# Patient Record
Sex: Female | Born: 1985 | Race: White | Hispanic: No | Marital: Married | Smoking: Never smoker
Health system: Southern US, Academic
[De-identification: ages and names within clinical notes are randomized; demographics above are authoritative.]

## PROBLEM LIST (undated history)

## (undated) HISTORY — PX: HX WISDOM TEETH EXTRACTION: SHX21

## (undated) HISTORY — PX: VASCULAR SURGERY: SHX849

---

## 2016-10-23 ENCOUNTER — Ambulatory Visit (HOSPITAL_COMMUNITY): Payer: Self-pay | Admitting: Obstetrics & Gynecology

## 2018-04-09 NOTE — L&D Delivery Note (Signed)
[facility]  Delivery Note      Name: Alexis Bowers   MRN: [id number]  DOB:  [birth year]  Admitted: 03/23/2019 12:20 PM       Pre-delivery diagnosis:    1. 33 y.o. G1P0 at [redacted]w[redacted]d gestation.   2. Spontaneous labor    Post-delivery diagnosis:    1. Same plus delivery of a viable neonate.     Findings:           Vaginal delivery of viable term infant/s. Infant(s) suctioned. Cord clamped x2 and cut. Infant to maternal abdomen.  Cord blood collected.  Placenta delivered. Good hemostasis noted. See below for delivery details.     Spontaneous vaginal delivery over second-degree midline perineal laceration.  Viable female infant weighing 7 lb 8 oz and Apgars of 8 and 9 at 1 and 5 minutes respectively.  Spontaneous delivery placenta 3 vessel cord.    Second-degree midline laceration repaired with 2-0 Vicryl in standard fashion    EBL 150 cc    Disposition:  Infant(s) admitted to floor (rooming in with mother).  Team Member Updated:.    "Delivery Information"    IO Blood Loss  03/23/19 1558 - 03/23/19 2217    QBL - (OB only) Hospital Encounter 150 mL    Total  150 mL         Uecker, Pending [[id number]]    Labor Events    Preterm labor?: No  Antibiotics received during labor?: No  Rupture date/time: 03/23/2019 1558  Rupture type: Artificial  Fluid color: Bloody  Induction: None  Augmentation: None          Labor Event Times    Labor onset Date/Time: 03/23/2019 1558   Dilation complete Date/Time: 03/23/2019 1931     Delivery Anesthesia    Method: Epidural     Assisted Delivery Details    Forceps attempted?: No  Vacuum extractor attempted?: No     Shoulder Dystocia Maneuvers    Shoulder dystocia present?: No     Lacerations    Episiotomy: None   Perineal lacerations: 2nd Repaired: Yes    Repaired: No   Repair suture: 2-0 Vicryl     Delivery Information    Birth date/time: 03/23/2019 2140  Sex: Female  Delivery type: Vaginal, Spontaneous     Newborn Presentation    Presentation: Vertex     Cord Information    Vessels:  3 Vessels  Complications: None  Cord blood disposition: Lab, Cord Segment Sent  Gases sent?: No  Stem cell collection (by MD): No     Resuscitation    Method: Oxygen, Suctioning  Neonatal resuscitation comments: blow by     Newborn  Apgars    Living status: Living      Skin color:    Heart rate:    Reflex Irrit:    Muscle tone:    Resp. effort:    Total:     1 Min:    0    2    2    2    2    8     5  Min:    1    2    2    2    2    9     10  Min:     15 Min:     20 Min:          Skin to Skin    Skin to skin initiation: 03/23/2019 2146  Skin to skin  with: Mother     Placenta    Date/time: 03/23/2019 2144  Removal: Spontaneous  Appearance: [name] , Intact  Disposition: Discarded     Other Delivery Procedures    Procedures: None     Delivery Providers    Delivering Clinician: Deloris Ping, MD   Provider Role   Radene Ou, RN Registered Nurse   Minta Balsam, RN Baby Nurse               Labor Event Times    Labor onset date/time: 03/23/2019 1558  Dilation complete date/time: 03/23/2019 1931            Deloris Ping, MD 03/23/2019, 22:17

## 2018-06-12 ENCOUNTER — Ambulatory Visit (HOSPITAL_COMMUNITY): Payer: Self-pay | Admitting: Surgery

## 2018-07-22 ENCOUNTER — Other Ambulatory Visit: Payer: Self-pay

## 2018-07-23 ENCOUNTER — Ambulatory Visit (INDEPENDENT_AMBULATORY_CARE_PROVIDER_SITE_OTHER): Payer: Self-pay | Admitting: Obstetrics & Gynecology

## 2018-07-23 ENCOUNTER — Telehealth: Payer: 59 | Admitting: Obstetrics & Gynecology

## 2018-07-23 ENCOUNTER — Encounter (INDEPENDENT_AMBULATORY_CARE_PROVIDER_SITE_OTHER): Payer: Self-pay | Admitting: Obstetrics & Gynecology

## 2018-07-23 DIAGNOSIS — Z3A01 Less than 8 weeks gestation of pregnancy: Secondary | ICD-10-CM

## 2018-07-23 DIAGNOSIS — Z3401 Encounter for supervision of normal first pregnancy, first trimester: Secondary | ICD-10-CM

## 2018-07-23 NOTE — Progress Notes (Signed)
TELEMEDICINE DOCUMENTATION:    Patient Location: telephone visit from home address: [address]  [place] [address]    Patient/family aware of provider location:  yes  Patient/family consent for telemedicine:  yes  Examination observed and performed by:  Lynden Oxford, MD  15 minutes was spent in discussion with the patient    Patient is an 33 y.o. year-old G1P0 at [redacted]w[redacted]d by lmp presenting for initial prenatal visit.  Pregnancy has been uncomplicated thus far. Taking pnv.  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     OB: G0  Gyn: denies history of abnormal paps or history of std    History reviewed. No pertinent past medical history.      No current outpatient medications on file.     Past Surgical History:   Procedure Laterality Date   . HX WISDOM TEETH EXTRACTION     . VASCULAR SURGERY Bilateral          Social History     Socioeconomic History   . Marital status: Single     Spouse name: Not on file   . Number of children: Not on file   . Years of education: Not on file   . Highest education level: Not on file   Tobacco Use   . Smoking status: Never Smoker   . Smokeless tobacco: Never Used   Substance and Sexual Activity   . Drug use: Never   . Sexual activity: Yes     Partners: Male       Review of Systems:  Constitutional: negative  HEENT: negative  Cardiac: negative  Respiratory: negative  GI: negative  GU: negative  Neuro: negative  Psych: negative    Physical Exam:  LMP 06/15/2018     deferred  General: alert, oriented, no acute distress  Psych: appropriate mood and affect      DIAGNOSIS: early pregnancy    Pap, cultures and labs deferred.  Genetic screening discussed and will consider.  Discussed practice dynamics, routine ob care.   Questions answered re caffeine, exercise, etc..  No follow-ups on file.

## 2018-07-30 ENCOUNTER — Telehealth (INDEPENDENT_AMBULATORY_CARE_PROVIDER_SITE_OTHER): Payer: Self-pay | Admitting: Obstetrics & Gynecology

## 2018-07-30 NOTE — Telephone Encounter (Signed)
Returned call to patient. Discussed all OTC meds for nausea. Ginger tablets. B6 & Doxylamine.  She voices understanding. Celestine Bougie, RN  07/30/2018, 10:44

## 2018-09-05 ENCOUNTER — Encounter (FREE_STANDING_LABORATORY_FACILITY): Payer: 59 | Admitting: Obstetrics & Gynecology

## 2018-09-05 ENCOUNTER — Ambulatory Visit (INDEPENDENT_AMBULATORY_CARE_PROVIDER_SITE_OTHER): Payer: 59 | Admitting: Obstetrics & Gynecology

## 2018-09-05 ENCOUNTER — Other Ambulatory Visit: Payer: Self-pay

## 2018-09-05 ENCOUNTER — Encounter (FREE_STANDING_LABORATORY_FACILITY)
Admit: 2018-09-05 | Discharge: 2018-09-05 | Disposition: A | Discharge status: Home / Self Care | Payer: 59 | Attending: Obstetrics & Gynecology | Admitting: Obstetrics & Gynecology

## 2018-09-05 VITALS — BP 100/60 | HR 98 | Wt 122.3 lb

## 2018-09-05 DIAGNOSIS — Z363 Encounter for antenatal screening for malformations: Secondary | ICD-10-CM

## 2018-09-05 DIAGNOSIS — Z369 Encounter for antenatal screening, unspecified: Secondary | ICD-10-CM

## 2018-09-05 DIAGNOSIS — Z3401 Encounter for supervision of normal first pregnancy, first trimester: Secondary | ICD-10-CM

## 2018-09-05 LAB — CBC
HCT: 34.3 % — ABNORMAL LOW (ref 34.8–46.0)
HGB: 11.6 g/dL (ref 11.5–16.0)
MCH: 31.2 pg (ref 26.0–32.0)
MCHC: 33.8 g/dL (ref 31.0–35.5)
MCV: 92.2 fL (ref 78.0–100.0)
MPV: 11.3 fL (ref 8.7–12.5)
PLATELETS: 199 10*3/uL (ref 150–400)
RBC: 3.72 10*6/uL — ABNORMAL LOW (ref 3.85–5.22)
RDW-CV: 13.1 % (ref 11.5–15.5)
WBC: 7.4 10*3/uL (ref 3.7–11.0)

## 2018-09-05 LAB — HIV1/HIV2 SCREEN, COMBINED ANTIGEN AND ANTIBODY: HIV SCREEN, COMBINED ANTIGEN & ANTIBODY: NEGATIVE

## 2018-09-05 LAB — HEPATITIS B SURFACE ANTIGEN: HBV SURFACE ANTIGEN QUALITATIVE: NEGATIVE

## 2018-09-05 NOTE — Progress Notes (Signed)
Alexis Bowers is a 33 y.o. female currently at [redacted]w[redacted]d      Chief Complaint   Patient presents with   . Routine Prenatal Visit   patient seen by Silvestre Gunner, PGY3.  No complaints.  Prenatal labs today.  Declines genetic screening.    OB History   Gravida Para Term Preterm AB Living   1 0 0 0 0 0   SAB TAB Ectopic Multiple Live Births   0 0 0 0 0       The ACOG flow sheet has been reviewed and updated.     Weight: 55.5 kg (122 lb 4.8 oz)  BP (Non-Invasive): 100/60  Fetal Movement: Absent  Edema: Negative  FHR (1): 122      ICD-10-CM    1. Primigravida in first trimester Z34.01    2. Prenatal screening encounter Z36.9 ABO/RH AND ANTIBODY SCREEN     CBC     RUBELLA VIRUS ANTIBODIES, IGG, SERUM     SYPHILIS SCREENING ALGORITHM WITH REFLEX (TITER, TP-PA), SERUM     URINE CULTURE     HEPATITIS B SURFACE ANTIGEN     HIV1/HIV2 SCREEN, COMBINED ANTIGEN AND ANTIBODY     CHLAMYDIA TRACHOMITIS DNA BY PCR (INHOUSE)     NEISSERIA GONORRHOEAE DNA BY PCR     HEPATITIS C ANTIBODY SCREEN WITH REFLEX TO HCV PCR   3. Screening, antenatal, for malformation by ultrasound Z36.3 OBG US TRANSABDOMINAL,AFTER 1ST TRI >14WKS       Return in about 4 weeks (around 10/03/2018) for ROB.    Lynden Oxford, MD 09/05/2018, 11:45

## 2018-09-05 NOTE — Nursing Note (Signed)
New ob labs drawn per order for this encounter, pt tolerated well.  Clayton Lefort, MA  09/05/2018, 12:01

## 2018-09-06 LAB — HEPATITIS C ANTIBODY SCREEN WITH REFLEX TO HCV PCR: HCV ANTIBODY QUALITATIVE: NEGATIVE

## 2018-09-06 LAB — URINE CULTURE: URINE CULTURE: >100000

## 2018-09-06 LAB — ABO/RH AND ANTIBODY SCREEN
ABO/RH(D): O POS
ANTIBODY SCREEN: NEGATIVE
ANTIBODY SCREEN: NEGATIVE

## 2018-09-07 LAB — CHLAMYDIA TRACHOMITIS DNA BY PCR (INHOUSE): CHLAMYDIA TRACHOMATIS PCR: NOT DETECTED

## 2018-09-07 LAB — NEISSERIA GONORRHOEAE DNA BY PCR: NEISSERIA GONORRHOEAE PCR: NOT DETECTED

## 2018-09-08 LAB — SYPHILIS SCREENING ALGORITHM WITH REFLEX (TITER, TP-PA), SERUM: TREPONEMAL AB QUALITATIVE: NONREACTIVE

## 2018-09-08 LAB — RUBELLA VIRUS ANTIBODIES, IGG, SERUM
RUBELLA IGG QUALITATIVE: 19.7 mmol/L — AB (ref 21.0–27.0)
RUBELLA IGG QUALITATIVE: POSITIVE

## 2018-09-17 ENCOUNTER — Telehealth (INDEPENDENT_AMBULATORY_CARE_PROVIDER_SITE_OTHER): Payer: Self-pay | Admitting: Obstetrics & Gynecology

## 2018-09-17 NOTE — Telephone Encounter (Signed)
Returned call to patient. Explained that we can not do an [place] before 20 weeks unless she was bleeding. Told her there are private companies that will do [place] for pregnant women.  She voices understanding. Barbara Keng, RN  09/17/2018, 10:26

## 2018-10-02 ENCOUNTER — Other Ambulatory Visit: Payer: Self-pay

## 2018-10-03 ENCOUNTER — Telehealth: Payer: 59 | Admitting: Primary Care

## 2018-10-03 ENCOUNTER — Encounter (INDEPENDENT_AMBULATORY_CARE_PROVIDER_SITE_OTHER): Payer: Self-pay | Admitting: Primary Care

## 2018-10-03 DIAGNOSIS — Z3A15 15 weeks gestation of pregnancy: Secondary | ICD-10-CM

## 2018-10-03 DIAGNOSIS — Z3402 Encounter for supervision of normal first pregnancy, second trimester: Secondary | ICD-10-CM

## 2018-10-03 DIAGNOSIS — Z3400 Encounter for supervision of normal first pregnancy, unspecified trimester: Secondary | ICD-10-CM | POA: Insufficient documentation

## 2018-10-03 NOTE — Progress Notes (Signed)
Obstetrics and [facility] [place] [address]  Phone: [phone]   [place] VISIT VIA telephone    Name: Alexis Bowers  MRN: [id number]  DOB: [birth year]    Date of Service:  10/03/2018    TELEMEDICINE DOCUMENTATION:  Patient Location visit from home address: [place] [address]   Patient/family aware of provider location:  yes  Patient/family consent for telemedicine:  yes  Examination observed and performed by: Burnett Harry, APRN,FNP-BC          Chief Complaint   Patient presents with   . Routine Prenatal Visit   telephone visit today, unable to use MyChart  Doing well, no complaints. N/v is improving.  Reviewed prenatal labs, all WNL.   Discussed covid-19 r/t pregnancy, prenatal care, and labor and delivery. Encouraged to follow CDC guidelines r/t COVID.   Plan RTC rob/sono for growth and anatomy 4 weeks.     OB History   Gravida Para Term Preterm AB Living   1 0 0 0 0 0   SAB TAB Ectopic Multiple Live Births   0 0 0 0 0       Gestational age: [redacted]w[redacted]d           ICD-10-CM    1. Primigravida in second trimester Z34.02        Disposition: Return in about 4 weeks (around 10/31/2018) for Sono/ROB.    Burnett Harry, APRN,FNP-BC 10/03/2018, 09:22

## 2018-10-27 ENCOUNTER — Telehealth (INDEPENDENT_AMBULATORY_CARE_PROVIDER_SITE_OTHER): Payer: Self-pay | Admitting: Obstetrics & Gynecology

## 2018-10-27 NOTE — Telephone Encounter (Signed)
Call from patient with complaint of being dizzy & lightheaded at times. She thinks her blood sugar is dropping at those times. Discussed the need to eat frequently & have a protein to maintain blood sugars & to drink plenty of water. She has tried numerous prenatal vitamins & realized that the one she was taking did not have folic acid.  She is now taking one with folic acid. Explained that should be fine since many foods we eat have folic acid. She has numerous questions. Instructed her to write all her questions down for her appointment on 11/03/18.  She voices understanding. Melinna Linarez, RN  10/27/2018, 08:51

## 2018-11-03 ENCOUNTER — Other Ambulatory Visit (INDEPENDENT_AMBULATORY_CARE_PROVIDER_SITE_OTHER): Payer: 59

## 2018-11-03 ENCOUNTER — Encounter (INDEPENDENT_AMBULATORY_CARE_PROVIDER_SITE_OTHER): Payer: Self-pay | Admitting: Advanced Practice Midwife

## 2018-11-03 ENCOUNTER — Ambulatory Visit (INDEPENDENT_AMBULATORY_CARE_PROVIDER_SITE_OTHER): Payer: 59 | Admitting: Advanced Practice Midwife

## 2018-11-03 ENCOUNTER — Other Ambulatory Visit: Payer: Self-pay

## 2018-11-03 VITALS — BP 106/60 | Wt 134.2 lb

## 2018-11-03 DIAGNOSIS — Z3402 Encounter for supervision of normal first pregnancy, second trimester: Secondary | ICD-10-CM

## 2018-11-03 DIAGNOSIS — Z3A20 20 weeks gestation of pregnancy: Secondary | ICD-10-CM

## 2018-11-03 DIAGNOSIS — Z3400 Encounter for supervision of normal first pregnancy, unspecified trimester: Secondary | ICD-10-CM

## 2018-11-03 DIAGNOSIS — Z363 Encounter for antenatal screening for malformations: Secondary | ICD-10-CM

## 2018-11-03 NOTE — Progress Notes (Signed)
Obstetrics and Gynecology [address]  [place] [address]  Phone: [phone]       Chief Complaint   Patient presents with   . Routine Prenatal Visit   Anatomy u/s today (girl)-Unremarkable fetal anatomy.  She is feeling fetal movement.  Nausea and vomiting has improved since last week.  She had one episode of dizziness last week but no further episodes.  She is trying to increase her water and eat small, frequent snacks. Pt has gained 10 lbs during pregnancy.  Discussed telemedicine appointment at next appointment and 8 wks in person with glucola.      OB History   Gravida Para Term Preterm AB Living   1 0 0 0 0 0   SAB TAB Ectopic Multiple Live Births   0 0 0 0 0       Gestational age: [redacted]w[redacted]d    Weight: 60.9 kg (134 lb 3.2 oz)  BP (Non-Invasive): 106/60  # of fetuses: 1  Fundal height: umbilicus  Preterm Labor: None  Fetal Movement: Present  Edema: Negative  FHR (1): 133 sono  Comments: Unremarkable fetal anatomy u/s      ICD-10-CM    1. Pregnancy, first Z34.00        Disposition: Return in about 4 weeks (around 12/01/2018), or telemedicine, 8 wks ROB with glucola.    Allie Dimmer, APRN 11/03/2018, 12:35

## 2018-12-01 ENCOUNTER — Encounter (INDEPENDENT_AMBULATORY_CARE_PROVIDER_SITE_OTHER): Payer: Self-pay | Admitting: Advanced Practice Midwife

## 2018-12-01 ENCOUNTER — Telehealth (INDEPENDENT_AMBULATORY_CARE_PROVIDER_SITE_OTHER): Payer: 59 | Admitting: Advanced Practice Midwife

## 2018-12-01 ENCOUNTER — Encounter (INDEPENDENT_AMBULATORY_CARE_PROVIDER_SITE_OTHER): Payer: Self-pay

## 2018-12-01 DIAGNOSIS — Z3A24 24 weeks gestation of pregnancy: Secondary | ICD-10-CM

## 2018-12-01 DIAGNOSIS — O9989 Other specified diseases and conditions complicating pregnancy, childbirth and the puerperium: Secondary | ICD-10-CM

## 2018-12-01 DIAGNOSIS — M545 Low back pain: Secondary | ICD-10-CM

## 2018-12-01 DIAGNOSIS — M25559 Pain in unspecified hip: Secondary | ICD-10-CM

## 2018-12-01 NOTE — Progress Notes (Signed)
Obstetrics and Gynecology [place] [address]  Phone: [place] VISIT VIA Telephone      Name: Alexis Bowers  MRN: [id number]  DOB: [birth year]    Date of Service:  12/01/2018    TELEMEDICINE DOCUMENTATION:  Patient Location:  MyChart video visit from home address: [place] [address]   Patient/family aware of provider location:  yes  Patient/family consent for phone visit:  yes  Examination observed and performed by: Allie Dimmer, APRN        Chief Complaint   Patient presents with   . Routine Prenatal Visit   Pt with some hip and lower back pain.  Discussed trying a maternity support belt for extra support.  Feeling more fetal movement since last appointment.  Glucola at next appointment in 4 weeks.  Questionable hypoechoic complex structure shadowing the right ovary, will get f/u sono in 8 weeks.      OB History   Gravida Para Term Preterm AB Living   1 0 0 0 0 0   SAB TAB Ectopic Multiple Live Births   0 0 0 0 0       Gestational age: [redacted]w[redacted]d           ICD-10-CM    1. Pregnancy, first, obstetrical care, second trimester  Z34.02    2. Cyst of right ovary  N83.201 OBG US TRANSABDOMINAL, FOLLOW UP       Disposition: Return 8 wks sono/rob, for ROB.    Allie Dimmer, APRN 12/01/2018, 11:11

## 2018-12-26 ENCOUNTER — Encounter (INDEPENDENT_AMBULATORY_CARE_PROVIDER_SITE_OTHER): Payer: Self-pay | Admitting: Obstetrics & Gynecology

## 2018-12-29 ENCOUNTER — Ambulatory Visit (INDEPENDENT_AMBULATORY_CARE_PROVIDER_SITE_OTHER): Payer: 59 | Admitting: Primary Care

## 2018-12-29 ENCOUNTER — Encounter (FREE_STANDING_LABORATORY_FACILITY): Payer: 59 | Admitting: Primary Care

## 2018-12-29 ENCOUNTER — Other Ambulatory Visit: Payer: Self-pay

## 2018-12-29 ENCOUNTER — Encounter (FREE_STANDING_LABORATORY_FACILITY)
Admit: 2018-12-29 | Discharge: 2018-12-29 | Disposition: A | Discharge status: Home / Self Care | Payer: 59 | Attending: Primary Care | Admitting: Primary Care

## 2018-12-29 ENCOUNTER — Encounter (INDEPENDENT_AMBULATORY_CARE_PROVIDER_SITE_OTHER): Payer: Self-pay | Admitting: Primary Care

## 2018-12-29 VITALS — BP 100/60 | Wt 143.4 lb

## 2018-12-29 DIAGNOSIS — Z3400 Encounter for supervision of normal first pregnancy, unspecified trimester: Secondary | ICD-10-CM

## 2018-12-29 DIAGNOSIS — Z369 Encounter for antenatal screening, unspecified: Secondary | ICD-10-CM | POA: Insufficient documentation

## 2018-12-29 DIAGNOSIS — Z3403 Encounter for supervision of normal first pregnancy, third trimester: Secondary | ICD-10-CM

## 2018-12-29 DIAGNOSIS — Z6740 Type O blood, Rh positive: Secondary | ICD-10-CM | POA: Insufficient documentation

## 2018-12-29 DIAGNOSIS — Z3A28 28 weeks gestation of pregnancy: Secondary | ICD-10-CM

## 2018-12-29 LAB — CBC
HCT: 34.6 % — ABNORMAL LOW (ref 34.8–46.0)
HGB: 11.3 g/dL — ABNORMAL LOW (ref 11.5–16.0)
MCH: 30.1 pg (ref 26.0–32.0)
MCHC: 32.7 g/dL (ref 31.0–35.5)
MCV: 92.3 fL (ref 78.0–100.0)
MPV: 11.9 fL (ref 8.7–12.5)
PLATELETS: 188 10*3/uL (ref 150–400)
RBC: 3.75 10*6/uL — ABNORMAL LOW (ref 3.85–5.22)
RDW-CV: 13 % (ref 11.5–15.5)
WBC: 8.6 10*3/uL (ref 3.7–11.0)

## 2018-12-29 LAB — GLUCOSE TOLERANCE TEST (GTT), 1 HOUR: GLUCOSE 1 HR POST DOSE: 120 mg/dL (ref <140)

## 2018-12-29 LAB — ABO/RH AND ANTIBODY SCREEN
ABO/RH(D): O POS
ANTIBODY SCREEN: NEGATIVE

## 2018-12-29 NOTE — Progress Notes (Signed)
Obstetrics and [facility] [place] [address]  Phone: [phone]       Chief Complaint   Patient presents with   . Prenatal Check     Doing well, no complaints. Reports good fetal movement. Denies contractions, ROM, vaginal bleeding.   Third trimester labs today. Declines Tdap and flu vaccines, both were advised to the patient.   Discussed covid-19 r/t pregnancy, prenatal care, and labor and delivery. Encouraged to follow CDC guidelines r/t COVID.     OB History   Gravida Para Term Preterm AB Living   1 0 0 0 0 0   SAB TAB Ectopic Multiple Live Births   0 0 0 0 0       Gestational age: [redacted]w[redacted]d    Weight: 65 kg (143 lb 6.4 oz)  BP (Non-Invasive): 100/60  Fundal height: 28  Preterm Labor: None  Fetal Movement: Present  Edema: Negative  FHR (1): 160s      ICD-10-CM    1. Pregnancy, first  Z34.00 GLUCOSE TOLERANCE TEST (GTT), 1 HOUR     CBC     ABO/RH AND ANTIBODY SCREEN     SYPHILIS SCREENING ALGORITHM WITH REFLEX (TITER, TP-PA), SERUM   2. Antenatal screening encounter  Z36.9 GLUCOSE TOLERANCE TEST (GTT), 1 HOUR     CBC     ABO/RH AND ANTIBODY SCREEN     SYPHILIS SCREENING ALGORITHM WITH REFLEX (TITER, TP-PA), SERUM       Disposition: Return in about 4 weeks (around 01/26/2019) for Sono/ROB.    Burnett Harry, APRN,FNP-BC 12/29/2018, 13:11

## 2018-12-30 LAB — SYPHILIS SCREENING ALGORITHM WITH REFLEX (TITER, TP-PA), SERUM: TREPONEMAL AB QUALITATIVE: NONREACTIVE

## 2019-01-01 ENCOUNTER — Telehealth (INDEPENDENT_AMBULATORY_CARE_PROVIDER_SITE_OTHER): Payer: Self-pay | Admitting: Primary Care

## 2019-01-01 NOTE — Telephone Encounter (Signed)
Called patient & discussed lab results. She is taking a prenatal that contains iron. Encouraged her to take daily.  She voices understanding. Sheana Bir, RN  01/01/2019, 09:01

## 2019-01-26 ENCOUNTER — Other Ambulatory Visit (INDEPENDENT_AMBULATORY_CARE_PROVIDER_SITE_OTHER): Payer: 59

## 2019-01-26 ENCOUNTER — Ambulatory Visit (INDEPENDENT_AMBULATORY_CARE_PROVIDER_SITE_OTHER): Payer: 59 | Admitting: Primary Care

## 2019-01-26 ENCOUNTER — Encounter (INDEPENDENT_AMBULATORY_CARE_PROVIDER_SITE_OTHER): Payer: Self-pay | Admitting: Primary Care

## 2019-01-26 ENCOUNTER — Other Ambulatory Visit: Payer: Self-pay

## 2019-01-26 VITALS — BP 98/70 | Temp 97.7°F | Wt 146.0 lb

## 2019-01-26 DIAGNOSIS — Z3403 Encounter for supervision of normal first pregnancy, third trimester: Secondary | ICD-10-CM

## 2019-01-26 DIAGNOSIS — O3483 Maternal care for other abnormalities of pelvic organs, third trimester: Secondary | ICD-10-CM

## 2019-01-26 DIAGNOSIS — Z3A32 32 weeks gestation of pregnancy: Secondary | ICD-10-CM

## 2019-01-26 DIAGNOSIS — N83201 Unspecified ovarian cyst, right side: Secondary | ICD-10-CM

## 2019-01-26 NOTE — Progress Notes (Signed)
Obstetrics and [facility] [place] [address]  Phone: [phone]       Chief Complaint   Patient presents with   . Routine Prenatal Visit     Sono today. Cephalic. MVP 4.3cm. Growth 57%.   Doing well, no complaints. Reports good fetal movement. Denies contractions, ROM, vaginal bleeding.   Declines Tdap and flu vaccine.   Discussed covid-19 r/t pregnancy, prenatal care, and labor and delivery. Encouraged to follow CDC guidelines r/t COVID.     OB History   Gravida Para Term Preterm AB Living   1 0 0 0 0 0   SAB TAB Ectopic Multiple Live Births   0 0 0 0 0       Gestational age: [redacted]w[redacted]d    Weight: 66.2 kg (146 lb)  BP (Non-Invasive): 98/70  Fundal height: 57% sono  Preterm Labor: None  Fetal Movement: Present  Presentation: Cephalic  Edema: Negative  FHR (1): 149 sono      ICD-10-CM    1. Primigravida in third trimester  Z34.03        Disposition: Return in about 2 weeks (around 02/09/2019) for ROB.    Burnett Harry, APRN,FNP-BC 01/26/2019, 15:42

## 2019-01-30 ENCOUNTER — Encounter (INDEPENDENT_AMBULATORY_CARE_PROVIDER_SITE_OTHER): Payer: Self-pay | Admitting: Obstetrics & Gynecology

## 2019-01-30 ENCOUNTER — Other Ambulatory Visit (INDEPENDENT_AMBULATORY_CARE_PROVIDER_SITE_OTHER): Payer: Self-pay

## 2019-02-10 ENCOUNTER — Encounter (INDEPENDENT_AMBULATORY_CARE_PROVIDER_SITE_OTHER): Payer: Self-pay | Admitting: Obstetrics & Gynecology

## 2019-02-10 ENCOUNTER — Ambulatory Visit (INDEPENDENT_AMBULATORY_CARE_PROVIDER_SITE_OTHER): Payer: 59 | Admitting: Obstetrics & Gynecology

## 2019-02-10 ENCOUNTER — Other Ambulatory Visit: Payer: Self-pay

## 2019-02-10 VITALS — BP 102/60 | Temp 97.8°F | Ht 67.0 in | Wt 151.0 lb

## 2019-02-10 DIAGNOSIS — Z3403 Encounter for supervision of normal first pregnancy, third trimester: Secondary | ICD-10-CM

## 2019-02-10 DIAGNOSIS — Z3A34 34 weeks gestation of pregnancy: Secondary | ICD-10-CM

## 2019-02-10 NOTE — Progress Notes (Signed)
Alexis Bowers is a 33 y.o. female currently at [redacted]w[redacted]d.  [address] are reviewed.  Patient does have some low back pain and intermittent contractions.   She is also moving which can add to her discomfort.       Chief Complaint   Patient presents with   . Routine Prenatal Visit       OB History   Gravida Para Term Preterm AB Living   1 0 0 0 0 0   SAB TAB Ectopic Multiple Live Births   0 0 0 0 0       Weight: 68.5 kg (151 lb)  BP (Non-Invasive): 102/60  Fundal height: 34  Preterm Labor: [address]  Fetal Movement: Present  Edema: Negative  FHR (1): 140      Assessment    ICD-10-CM    1. Primigravida in third trimester  Z34.03        Plan  Return in about 2 weeks (around 02/24/2019) for ROB with GBS due.    Robet Leu, MD 02/10/2019, 14:58

## 2019-02-18 ENCOUNTER — Telehealth (INDEPENDENT_AMBULATORY_CARE_PROVIDER_SITE_OTHER): Payer: Self-pay | Admitting: Obstetrics & Gynecology

## 2019-02-18 NOTE — Telephone Encounter (Signed)
Call from patient. She states she has been having "lightening crotch" pains, pressure & Braxton Hicks. Denies bleeding or leaking of fluid or decreased fluid. Discussed going to hospital it contractions become 5 minutes apart for one hour or for bleeding, leaking of fluid or decreased movement.  She voices understanding. Zarius Furr, RN  02/18/2019, 10:29

## 2019-02-24 ENCOUNTER — Encounter (INDEPENDENT_AMBULATORY_CARE_PROVIDER_SITE_OTHER): Payer: Self-pay | Admitting: Obstetrics & Gynecology

## 2019-02-24 ENCOUNTER — Ambulatory Visit (INDEPENDENT_AMBULATORY_CARE_PROVIDER_SITE_OTHER): Payer: 59 | Admitting: Obstetrics & Gynecology

## 2019-02-24 ENCOUNTER — Other Ambulatory Visit: Payer: Self-pay

## 2019-02-24 VITALS — BP 102/60 | Temp 97.7°F | Ht 67.0 in | Wt 153.0 lb

## 2019-02-24 DIAGNOSIS — Z3A36 36 weeks gestation of pregnancy: Secondary | ICD-10-CM

## 2019-02-24 DIAGNOSIS — Z3403 Encounter for supervision of normal first pregnancy, third trimester: Secondary | ICD-10-CM

## 2019-02-24 NOTE — Progress Notes (Signed)
Cylinda Santoli is a 33 y.o. female currently at [redacted]w[redacted]d labor precautions are given.  Group B strep was done today.      Chief Complaint   Patient presents with   . Routine Prenatal Visit       OB History   Gravida Para Term Preterm AB Living   1 0 0 0 0 0   SAB TAB Ectopic Multiple Live Births   0 0 0 0 0       Weight: 69.4 kg (153 lb)  BP (Non-Invasive): 102/60  Fundal height: 36  Preterm Labor: [address]  Fetal Movement: Present  Presentation: Cephalic  Edema: Negative  FHR (1): 159      Assessment    ICD-10-CM    1. Encounter for supervision of normal first pregnancy in third trimester  Z34.03 GROUP B STREPTOCOCCUS DNA BY PCR       Plan  Return in about 1 week (around 03/03/2019) for [place].    Robet Leu, MD 02/24/2019, 16:16

## 2019-02-25 ENCOUNTER — Encounter (INDEPENDENT_AMBULATORY_CARE_PROVIDER_SITE_OTHER): Payer: Self-pay

## 2019-02-25 NOTE — Nursing Note (Signed)
UML called in, the lab was sent a culture with no patient information on specimen, no ID sticker, test was cancelled, patient will need to repeat next visit.

## 2019-03-02 ENCOUNTER — Ambulatory Visit (INDEPENDENT_AMBULATORY_CARE_PROVIDER_SITE_OTHER): Payer: 59 | Admitting: Advanced Practice Midwife

## 2019-03-02 ENCOUNTER — Encounter (FREE_STANDING_LABORATORY_FACILITY)
Admit: 2019-03-02 | Discharge: 2019-03-02 | Disposition: A | Discharge status: Home / Self Care | Payer: 59 | Attending: Advanced Practice Midwife | Admitting: Advanced Practice Midwife

## 2019-03-02 ENCOUNTER — Other Ambulatory Visit: Payer: Self-pay

## 2019-03-02 ENCOUNTER — Encounter (FREE_STANDING_LABORATORY_FACILITY): Payer: 59 | Admitting: Advanced Practice Midwife

## 2019-03-02 VITALS — BP 100/64 | Wt 153.0 lb

## 2019-03-02 DIAGNOSIS — Z369 Encounter for antenatal screening, unspecified: Secondary | ICD-10-CM | POA: Insufficient documentation

## 2019-03-02 DIAGNOSIS — Z3403 Encounter for supervision of normal first pregnancy, third trimester: Secondary | ICD-10-CM

## 2019-03-02 DIAGNOSIS — Z3A37 37 weeks gestation of pregnancy: Secondary | ICD-10-CM

## 2019-03-02 NOTE — Progress Notes (Signed)
Obstetrics and Gynecology [address]  [place] [address]  Phone: [phone]       Chief Complaint   Patient presents with   . Routine Prenatal Visit   Pt with normal aches and pains of pregnancy.  Active fetal movement.  Denies ROM or vaginal bleeding. Continues to have irregular contractions.  GBS collected.  Labor precautions and fetal movement reviewed.      OB History   Gravida Para Term Preterm AB Living   1 0 0 0 0 0   SAB TAB Ectopic Multiple Live Births   0 0 0 0 0       Gestational age: [redacted]w[redacted]d    Weight: 69.4 kg (153 lb)  BP (Non-Invasive): 100/64  # of fetuses: 1  Fundal height: 37  Preterm Labor: [address]  Fetal Movement: Present  Presentation: Cephalic  Edema: Negative  FHR (1): 150s      ICD-10-CM    1. Pregnancy, first, obstetrical care, third trimester  Z34.03    2. Visit for prenatal screening  Z36.9 GROUP B STREPTOCOCCUS DNA BY PCR     GROUP B STREPTOCOCCUS DNA BY PCR       Disposition: Return in about 1 week (around 03/09/2019) for ROB.    Allie Dimmer, APRN 03/02/2019, 16:21

## 2019-03-04 LAB — GROUP B STREPTOCOCCUS DNA BY NAAT: GROUP B STREPTOCOCCUS (GBS) DNA BY NAAT: NEGATIVE

## 2019-03-09 NOTE — Progress Notes (Signed)
OB/GYN CLINIC, PHYSICIANS OFFICE BUILDING  [place] [address]  [facility]  Telephone Visit     Name: Alexis Bowers  MRN: [id number]    Date: 03/10/2019  Age: 33 y.o.                          Patient's location: [place] [id number]   Patient/family aware of provider location: Yes  Patient/family consent for telphone visit: Yes  Interview and observation performed by: Jeneen Montgomery, NP    NOTE AUTHORED BY Jeneen Montgomery, MSN, APRN, [facility] - per [facility] requirements.    Chief Complaint   Patient presents with   . Routine Prenatal Visit     33yo G1P0 in clinic today for [place] visit at [redacted]W[redacted]D in office. Pt denies s/sx UIT/PIH/Labor. Fetal movemenet is present per pt. Pt c/o Braxton hicks contractions, denies ROM/VB.      OB History   Gravida Para Term Preterm AB Living   1             SAB TAB Ectopic Multiple Live Births                  # Outcome Date GA Lbr Len/2nd Weight Sex Delivery Anes PTL Lv   1 Current              Preterm Labor: [address]  Fetal Movement: Present  Presentation: Unable To Assess  Edema: Negative  FHR (1): +fetal movement  Comments: telephone visit      ICD-10-CM    1. Encounter for supervision of normal first pregnancy in third trimester  Z34.03      Pt ed on fetal kick count, s/sx UTI/PIH/Labor, and when to seek emergent care reviewed. GBS completed and negative. Pt ed on COVID-19 recommendations on social distancing and need to space f/u visit, pt agrees with plan of care. Pt ed on the need to monitor BP and weight at home at home with telephone and video visits when available. Pt ed on need to self quarantine after 37 weeks until deliver other than her visits    Disposition: Return in about 1 week (around 03/17/2019) for [place] in office.    Jeneen Montgomery, NP  03/10/2019, 15:04

## 2019-03-10 ENCOUNTER — Telehealth: Payer: 59 | Admitting: Nurse Practitioner

## 2019-03-10 ENCOUNTER — Encounter (INDEPENDENT_AMBULATORY_CARE_PROVIDER_SITE_OTHER): Payer: Self-pay | Admitting: Nurse Practitioner

## 2019-03-10 DIAGNOSIS — O471 False labor at or after 37 completed weeks of gestation: Secondary | ICD-10-CM

## 2019-03-10 DIAGNOSIS — Z3403 Encounter for supervision of normal first pregnancy, third trimester: Secondary | ICD-10-CM

## 2019-03-10 DIAGNOSIS — Z3A38 38 weeks gestation of pregnancy: Secondary | ICD-10-CM

## 2019-03-16 NOTE — Progress Notes (Signed)
NOTE AUTHORED BY Jeneen Montgomery, MSN, APRN, [facility] - per [facility] requirements.    Chief Complaint   Patient presents with   . Routine Prenatal Visit     33yo G1P0 in clinic today for [place] visit at [redacted]W[redacted]D in office. Pt denies s/sx UIT/PIH/Labor. Fetal movemenet is present per pt. Pt c/o Braxton hicks contractions, denies ROM/VB. Induction sch for 03/29/2019 at 2000 for post dates. Pt declined examination today.      OB History   Gravida Para Term Preterm AB Living   1             SAB TAB Ectopic Multiple Live Births                  # Outcome Date GA Lbr Len/2nd Weight Sex Delivery Anes PTL Lv   1 Current              Weight: 70.8 kg (156 lb)  BP (Non-Invasive): 112/70  Fundal height: 39  Preterm Labor: [address]  Fetal Movement: Present  Presentation: Unable To Assess  Edema: Negative  FHR (1): 135      ICD-10-CM    1. Encounter for supervision of normal first pregnancy in third trimester  Z34.03      Pt ed on fetal kick count, s/sx UTI/PIH/Labor, and when to seek emergent care reviewed. Ed on ligament pain,body mechanics, and encouraged abdominal support band. GBS completed and negative. Pt ed on COVID-19 recommendations on social distancing and need to self quarantine until deliver other than her visits    Disposition: Return in about 1 week (around 03/24/2019) for [place] in office.    Jeneen Montgomery, NP  03/17/2019, 12:23

## 2019-03-17 ENCOUNTER — Encounter (INDEPENDENT_AMBULATORY_CARE_PROVIDER_SITE_OTHER): Payer: Self-pay | Admitting: Nurse Practitioner

## 2019-03-17 ENCOUNTER — Ambulatory Visit (INDEPENDENT_AMBULATORY_CARE_PROVIDER_SITE_OTHER): Payer: 59 | Admitting: Nurse Practitioner

## 2019-03-17 ENCOUNTER — Other Ambulatory Visit: Payer: Self-pay

## 2019-03-17 VITALS — BP 112/70 | Ht 67.0 in | Wt 156.0 lb

## 2019-03-17 DIAGNOSIS — Z3403 Encounter for supervision of normal first pregnancy, third trimester: Secondary | ICD-10-CM

## 2019-03-17 DIAGNOSIS — Z3A39 39 weeks gestation of pregnancy: Secondary | ICD-10-CM

## 2019-03-18 ENCOUNTER — Other Ambulatory Visit: Payer: Self-pay

## 2019-03-18 ENCOUNTER — Ambulatory Visit (HOSPITAL_COMMUNITY): Payer: 59 | Admitting: Obstetrics & Gynecology

## 2019-03-18 ENCOUNTER — Ambulatory Visit
Admission: RE | Admit: 2019-03-18 | Discharge: 2019-03-18 | Disposition: A | Discharge status: Home / Self Care | Payer: 59 | Source: Ambulatory Visit | Attending: Obstetrics & Gynecology | Admitting: Obstetrics & Gynecology

## 2019-03-18 DIAGNOSIS — Z3490 Encounter for supervision of normal pregnancy, unspecified, unspecified trimester: Secondary | ICD-10-CM | POA: Insufficient documentation

## 2019-03-18 DIAGNOSIS — Z3A00 Weeks of gestation of pregnancy not specified: Secondary | ICD-10-CM | POA: Insufficient documentation

## 2019-03-18 NOTE — Nurses Notes (Signed)
Dr honaker notified of pt status.Pt instructed to return to triage if any leaking fluid, bleeding, decreased fetal movement, or contractions less than 5 minutes apart for 1 hour. Pt instructed to keep next regularly scheduled MD appt. Pt verbalized understanding and discharged from triage at this time.

## 2019-03-19 ENCOUNTER — Other Ambulatory Visit: Payer: Self-pay

## 2019-03-19 ENCOUNTER — Ambulatory Visit (HOSPITAL_COMMUNITY): Payer: 59

## 2019-03-19 ENCOUNTER — Ambulatory Visit
Admission: RE | Admit: 2019-03-19 | Discharge: 2019-03-19 | Disposition: A | Discharge status: Home / Self Care | Payer: 59 | Source: Ambulatory Visit | Attending: Obstetrics & Gynecology | Admitting: Obstetrics & Gynecology

## 2019-03-19 ENCOUNTER — Ambulatory Visit (HOSPITAL_COMMUNITY): Payer: 59 | Admitting: Obstetrics & Gynecology

## 2019-03-19 DIAGNOSIS — Z3A00 Weeks of gestation of pregnancy not specified: Secondary | ICD-10-CM | POA: Insufficient documentation

## 2019-03-19 DIAGNOSIS — Z3490 Encounter for supervision of normal pregnancy, unspecified, unspecified trimester: Secondary | ICD-10-CM | POA: Insufficient documentation

## 2019-03-19 NOTE — Nurses Notes (Signed)
Pt presents to triage for repeat pelvic exam and nst before traveling home to [place]. Pt states contractions have spaced out since yesterday. Denies leaking fluid or vaginal bleeding.

## 2019-03-19 NOTE — Nurses Notes (Signed)
Pt instructed to return to triage if any leaking fluid, bleeding, decreased fetal movement, or contractions less than 5 minutes apart for 1 hour. Pt instructed to keep next regularly scheduled MD appt. Pt verbalized understanding and discharged from triage at this time.

## 2019-03-23 ENCOUNTER — Inpatient Hospital Stay (HOSPITAL_COMMUNITY): Payer: 59 | Admitting: Anesthesiology

## 2019-03-23 ENCOUNTER — Encounter (HOSPITAL_COMMUNITY): Payer: Self-pay | Admitting: Obstetrics & Gynecology

## 2019-03-23 ENCOUNTER — Inpatient Hospital Stay (HOSPITAL_COMMUNITY): Payer: Self-pay | Admitting: Obstetrics & Gynecology

## 2019-03-23 ENCOUNTER — Inpatient Hospital Stay
Admission: RE | Admit: 2019-03-23 | Discharge: 2019-03-25 | DRG: 807 | Disposition: A | Discharge status: Home / Self Care | Payer: 59 | Attending: Obstetrics & Gynecology | Admitting: Obstetrics & Gynecology

## 2019-03-23 ENCOUNTER — Other Ambulatory Visit: Payer: Self-pay

## 2019-03-23 ENCOUNTER — Encounter (HOSPITAL_COMMUNITY): Payer: Self-pay | Admitting: Anesthesiology

## 2019-03-23 DIAGNOSIS — R109 Unspecified abdominal pain: Secondary | ICD-10-CM

## 2019-03-23 DIAGNOSIS — Z3A40 40 weeks gestation of pregnancy: Secondary | ICD-10-CM

## 2019-03-23 DIAGNOSIS — Z79899 Other long term (current) drug therapy: Secondary | ICD-10-CM

## 2019-03-23 DIAGNOSIS — O26899 Other specified pregnancy related conditions, unspecified trimester: Secondary | ICD-10-CM

## 2019-03-23 LAB — TYPE AND SCREEN
ABO/RH(D): O POS
ANTIBODY SCREEN: NEGATIVE

## 2019-03-23 LAB — URINALYSIS, MACRO/MICRO
BILIRUBIN: NEGATIVE mg/dL
GLUCOSE: NEGATIVE mg/dL
KETONES: NEGATIVE mg/dL
NITRITE: NEGATIVE
PH: 7 (ref 5.0–7.0)
PROTEIN: NEGATIVE mg/dL
SPECIFIC GRAVITY: 1.016 (ref 1.010–1.025)
UROBILINOGEN: 0.2 mg/dL

## 2019-03-23 LAB — URINE DRUG SCREEN, OB
AMPHETAMINES URINE: NEGATIVE
BARBITURATES URINE: NEGATIVE
BENZODIAZEPINES URINE: NEGATIVE
BUPRENORPHINE URINE: NEGATIVE
CANNABINOIDS URINE: NEGATIVE
COCAINE METABOLITES URINE: NEGATIVE
METHADONE URINE: NEGATIVE
OPIATES URINE: NEGATIVE
OXYCODONE URINE: NEGATIVE
PCP URINE: NEGATIVE

## 2019-03-23 LAB — CBC
HCT: 35.2 % (ref 34.6–46.2)
HGB: 11.7 g/dL — ABNORMAL LOW (ref 11.8–15.8)
MCH: 28.5 pg (ref 27.6–33.2)
MCHC: 33.3 g/dL (ref 32.6–35.4)
MCV: 85.5 fL (ref 82.3–96.7)
MPV: 10.2 fL (ref 6.6–10.2)
PLATELETS: 176 10*3/uL (ref 140–440)
RBC: 4.12 10*6/uL (ref 3.80–5.24)
RDW: 13.2 % (ref 12.4–15.2)
WBC: 8.6 10*3/uL (ref 3.5–10.3)

## 2019-03-23 MED ORDER — DOCUSATE SODIUM 100 MG CAPSULE
100.0000 mg | ORAL_CAPSULE | Freq: Two times a day (BID) | ORAL | Status: DC | PRN
Start: 2019-03-23 — End: 2019-03-23

## 2019-03-23 MED ORDER — IBUPROFEN 600 MG TABLET
600.0000 mg | ORAL_TABLET | Freq: Four times a day (QID) | ORAL | Status: DC | PRN
Start: 2019-03-23 — End: 2019-03-25
  Administered 2019-03-23 – 2019-03-25 (×5): 600 mg via ORAL
  Filled 2019-03-23 (×5): qty 1

## 2019-03-23 MED ORDER — SODIUM CHLORIDE 0.9 % (FLUSH) INJECTION SYRINGE
3.0000 mL | INJECTION | INTRAMUSCULAR | Status: DC | PRN
Start: 2019-03-23 — End: 2019-03-24

## 2019-03-23 MED ORDER — SODIUM CHLORIDE 0.9 % (FLUSH) INJECTION SYRINGE
3.0000 mL | INJECTION | Freq: Three times a day (TID) | INTRAMUSCULAR | Status: DC
Start: 2019-03-23 — End: 2019-03-24
  Administered 2019-03-23 – 2019-03-24 (×2): 0 mL
  Administered 2019-03-24: 14:00:00

## 2019-03-23 MED ORDER — SODIUM CHLORIDE 0.9 % (FLUSH) INJECTION SYRINGE
3.00 mL | INJECTION | Freq: Three times a day (TID) | INTRAMUSCULAR | Status: AC | PRN
Start: 2019-03-23 — End: ?

## 2019-03-23 MED ORDER — IBUPROFEN 600 MG TABLET
600.0000 mg | ORAL_TABLET | Freq: Four times a day (QID) | ORAL | Status: DC | PRN
Start: 2019-03-23 — End: 2019-03-23

## 2019-03-23 MED ORDER — ACETAMINOPHEN 325 MG TABLET
650.0000 mg | ORAL_TABLET | ORAL | Status: DC | PRN
Start: 2019-03-23 — End: 2019-03-23

## 2019-03-23 MED ORDER — LACTATED RINGERS IV BOLUS
1000.0000 mL | INJECTION | Freq: Once | Status: AC
Start: 2019-03-23 — End: 2019-03-23
  Administered 2019-03-23: 1000 mL via INTRAVENOUS
  Administered 2019-03-23: 15:00:00 0 mL via INTRAVENOUS

## 2019-03-23 MED ORDER — HYDROCODONE 5 MG-ACETAMINOPHEN 325 MG TABLET
1.0000 | ORAL_TABLET | ORAL | Status: DC | PRN
Start: 2019-03-23 — End: 2019-03-25
  Administered 2019-03-24 – 2019-03-25 (×6): 1 via ORAL
  Filled 2019-03-23 (×6): qty 1

## 2019-03-23 MED ORDER — SODIUM CHLORIDE 0.9 % (FLUSH) INJECTION SYRINGE
3.0000 mL | INJECTION | Freq: Three times a day (TID) | INTRAMUSCULAR | Status: DC
Start: 2019-03-23 — End: 2019-03-24
  Administered 2019-03-23 (×2): 0 mL
  Administered 2019-03-24: 06:00:00

## 2019-03-23 MED ORDER — SODIUM CHLORIDE 0.9 % (FLUSH) INJECTION SYRINGE
3.0000 mL | INJECTION | Freq: Three times a day (TID) | INTRAMUSCULAR | Status: DC
Start: 2019-03-23 — End: 2019-03-23
  Administered 2019-03-23: 14:00:00 0 mL

## 2019-03-23 MED ORDER — GLYCERIN-WITCH HAZEL 12.5 %-50 % TOPICAL PADS
MEDICATED_PAD | CUTANEOUS | Status: AC | PRN
Start: 2019-03-23 — End: ?
  Administered 2019-03-24: via CUTANEOUS
  Filled 2019-03-23: qty 40

## 2019-03-23 MED ORDER — ONDANSETRON HCL 4 MG TABLET
4.0000 mg | ORAL_TABLET | Freq: Three times a day (TID) | ORAL | Status: AC | PRN
Start: 2019-03-23 — End: 2019-03-24
  Filled 2019-03-23: qty 1

## 2019-03-23 MED ORDER — EPHEDRINE SULFATE 50 MG/ML INJECTION SOLUTION
5.0000 mg | INTRAMUSCULAR | Status: DC | PRN
Start: 2019-03-23 — End: 2019-03-24

## 2019-03-23 MED ORDER — BENZOCAINE 20 %-MENTHOL 0.5 % TOPICAL AEROSOL
1.0000 | INHALATION_SPRAY | Freq: Once | CUTANEOUS | Status: AC
Start: 2019-03-24 — End: 2019-03-24
  Administered 2019-03-24: 1 via TOPICAL
  Filled 2019-03-23: qty 56

## 2019-03-23 MED ORDER — ACETAMINOPHEN 325 MG TABLET
650.0000 mg | ORAL_TABLET | ORAL | Status: DC | PRN
Start: 2019-03-23 — End: 2019-03-25

## 2019-03-23 MED ORDER — ONDANSETRON HCL 4 MG TABLET
4.0000 mg | ORAL_TABLET | Freq: Three times a day (TID) | ORAL | Status: DC | PRN
Start: 2019-03-23 — End: 2019-03-23
  Filled 2019-03-23: qty 1

## 2019-03-23 MED ORDER — HYDROCODONE 5 MG-ACETAMINOPHEN 325 MG TABLET
1.0000 | ORAL_TABLET | ORAL | Status: DC | PRN
Start: 2019-03-23 — End: 2019-03-23

## 2019-03-23 MED ORDER — DOCUSATE SODIUM 100 MG CAPSULE
100.0000 mg | ORAL_CAPSULE | Freq: Two times a day (BID) | ORAL | Status: DC | PRN
Start: 2019-03-23 — End: 2019-03-25

## 2019-03-23 MED ORDER — BENZOCAINE 20 %-MENTHOL 0.5 % TOPICAL AEROSOL
1.0000 | INHALATION_SPRAY | Freq: Once | CUTANEOUS | Status: DC
Start: 2019-03-23 — End: 2019-03-23

## 2019-03-23 MED ORDER — ROPIVACAINE (PF) 2 MG/ML (0.2 %) INJECTION SOLUTION
Freq: Once | INTRAMUSCULAR | Status: DC | PRN
Start: 2019-03-23 — End: 2019-03-23
  Administered 2019-03-23: 5 mL via EPIDURAL

## 2019-03-23 MED ORDER — LACTATED RINGERS INTRAVENOUS SOLUTION
INTRAVENOUS | Status: AC
Start: 2019-03-23 — End: ?
  Administered 2019-03-23: 18:00:00 via INTRAVENOUS

## 2019-03-23 MED ORDER — FENTANYL-ROPIVACAINE-NACL (PF) 2 MCG/ML-0.2 % INJECTION SOLUTION
INTRAMUSCULAR | Status: DC
Start: 2019-03-23 — End: 2019-03-24
  Administered 2019-03-23: 22:00:00 0 mL/h via EPIDURAL
  Administered 2019-03-23: 12 mL/h via EPIDURAL
  Filled 2019-03-23: qty 100

## 2019-03-23 NOTE — Care Plan (Signed)
Problem: Bleeding (Labor)  Goal: Hemostasis  Outcome: Ongoing (see interventions/notes)     Problem: Change in Fetal Wellbeing (Labor)  Goal: Stable Fetal Wellbeing  Outcome: Ongoing (see interventions/notes)     Problem: Delayed Labor Progression (Labor)  Goal: Effective Progression to Delivery  Outcome: Ongoing (see interventions/notes)     Problem: Infection (Labor)  Goal: Absence of Infection Signs and Symptoms  Outcome: Ongoing (see interventions/notes)     Problem: Labor Pain (Labor)  Goal: Acceptable Pain Control  Outcome: Ongoing (see interventions/notes)     Problem: Uterine Tachysystole (Labor)  Goal: Normal Uterine Contraction Pattern  Outcome: Ongoing (see interventions/notes)

## 2019-03-23 NOTE — Nurses Notes (Signed)
Pt ambulated to triage with complaints on cramping every 2-4 minutes since 11:00 this am. Denies ROM or any other complaints at this time. Dr Laurann Montana notified of dilation.

## 2019-03-23 NOTE — Anesthesia Procedure Notes (Signed)
Lumbar Epidural   Performed by:    Performing Provider:  Adeena Bernabe, DO  Authorizing Provider:  Hendrix Yurkovich, DO    Indication: pain relief in labor and delivery        Pt location: At bedside  Technique: ( See MAR for exact doses)  Technique/Approach: midline        Needle Level: L2-3  Sterile Skin Prep : sterile technique     Site verified, H&P updated and consent obtained, Patient monitors applied, Timeout performed, Emergency drugs and equipment available, Patient positioned and anesthesia consent given   Patient position: sitting      Needle/Catheter: Needle type: Tuohy   Needle Gauge: 17 G   Epidural Injection Technique LOR saline  Needle insertion depth 5 cm Catheter length in space: 10 cm   Epidural catheter location: lumbar (1-5)  Number of attempts: 1  Events: no complications,         Dosing:      Test Dose: 3 mL,  lidocaine 1.5% with epinephrine 1:200,000       Catheter: sterile dressing applied         Patient response: adequate sensory block

## 2019-03-23 NOTE — H&P (Signed)
[facility] Department of Obstetric & Gynecology      HISTORY AND PHYSICAL    PATIENT: Alexis Bowers  CHART NUMBER: [id number]  DATE OF SERVICE: 03/23/2019      PRIMARY OB:        CC:  Contractions    HPI     Alexis Bowers is a 33 y.o. G1P0 at [redacted]w[redacted]d who presents to labor and delivery for contractions.  Positive fetal movement no vaginal bleeding.  Patient is from [facility]    Lab Results   Component Value Date    ABORHD PENDING 03/23/2019    HGB 11.7 (L) 03/23/2019    HCT 35.2 03/23/2019     GBS negative    OB HISTORY      Dating Summary      Working EDD: 03/22/19 set by Alexis Bowers, [place] on 07/23/18 based on Last Menstrual Period on 06/15/18          Based On EDD GA Diff GA User Date    Last Menstrual Period on 06/15/18   03/22/19 Working  Alexis Bowers, [place] 07/23/18           OB History   Gravida Para Term Preterm AB Living   1             SAB TAB Ectopic Multiple Live Births                  # Outcome Date GA Lbr Len/2nd Weight Sex Delivery Anes PTL Lv   1 Current                REVIEW OF SYSTEMS     All other ROS Negative    PAST MEDICAL HISTORY     No past medical history on file.  Past Medical History was reviewed and is negative for Illness      PAST SURGICAL HISTORY     Past Surgical History:   Procedure Laterality Date   . HX WISDOM TEETH EXTRACTION     . VASCULAR SURGERY Bilateral            FAMILY HISTORY     Family Medical History:     Problem Relation (Age of Onset)    Osteoporosis Mother              FH of BD, MR, SZs or bleeding/clotting disorders    SOCIAL HISTORY     Social History     Tobacco Use   . Smoking status: Never Smoker   . Smokeless tobacco: Never Used   Substance Use Topics   . Alcohol use: Not on file     Comment: socially         CURRENT MEDICATIONS and ALLERGIES     CURRENT MEDICATIONS:   Medications Prior to Admission     Prescriptions    prenatal vit 75/iron/folic/om3 (ONE A DAY WOMEN'S PRENATAL DHA ORAL)    Take by mouth           ALLERGIES:  Patient has no known  allergies.     PHYSICAL EXAMINATION     Filed Vitals:    03/23/19 1428 03/23/19 1433 03/23/19 1438 03/23/19 1443   BP:       Pulse: 56 81 60 68   Resp:       Temp:       SpO2: 100% 100% 100% 100%       General: appears in good health  HENNT: unremarkable  Lungs: no respiratory distress  Abdomen: gravid  Extremities: extremities normal, atraumatic, no cyanosis or edema    SVE: 4/80/-2  FHRT:  140  TOCO:  q 3-5 minutes    LABS and IMAGING     Lab Results Today:    Results for orders placed or performed during the hospital encounter of 03/23/19 (from the past 24 hour(s))   TYPE AND SCREEN   Result Value Ref Range    UNITS ORDERED NOT STATED         ABO/RH(D) PENDING     ANTIBODY SCREEN PENDING     SPECIMEN EXPIRATION DATE 03/26/2019    URINALYSIS, MACRO/MICRO   Result Value Ref Range    COLOR Yellow Yellow, Straw, Colorless    APPEARANCE Cloudy Clear, Cloudy    SPECIFIC GRAVITY 1.016 1.010 - 1.025    PH 7.0 5.0 - 7.0    LEUKOCYTES Moderate (A) Negative WBCs/uL    NITRITE Negative Negative    PROTEIN Negative Negative, Trace mg/dL    GLUCOSE Negative Negative mg/dL    KETONES Negative Negative mg/dL    UROBILINOGEN 0.2  Normal, 0.2 , 1.0 mg/dL    BILIRUBIN Negative Negative mg/dL    BLOOD Small (A) Negative mg/dL    URINALYSIS COMMENTS STOP     RBCS 0-2 (A) None /hpf    WBCS 20-50 (A) None /hpf    BACTERIA None None /hpf    SQUAMOUS EPITHELIAL Occasional None, Rare, Occasional /hpf   URINE DRUG SCREEN, OB   Result Value Ref Range    AMPHETAMINES URINE Negative Negative    BARBITURATES URINE Negative Negative    BENZODIAZEPINES URINE Negative Negative    COCAINE METABOLITES URINE Negative Negative    OPIATES URINE Negative Negative    PCP URINE Negative Negative    CANNABINOIDS URINE Negative Negative    BUPRENORPHINE URINE Negative Negative    METHADONE URINE Negative Negative    OXYCODONE URINE Negative Negative   CBC   Result Value Ref Range    WBC 8.6 3.5 - 10.3 x10^3/uL    RBC 4.12 3.80 - 5.24 x10^6/uL    HGB 11.7  (L) 11.8 - 15.8 g/dL    HCT 16.1 09.6 - 04.5 %    MCV 85.5 82.3 - 96.7 fL    MCH 28.5 27.6 - 33.2 pg    MCHC 33.3 32.6 - 35.4 g/dL    RDW 40.9 81.1 - 91.4 %    PLATELETS 176 140 - 440 x10^3/uL    MPV 10.2 6.6 - 10.2 fL        N/A    ASSESSMENT and PLAN     This is a 33 y.o. G1P0 at [redacted]w[redacted]d    1)  G1P0   2) GBS status:  Negative  Active spontaneous labor anticipate vaginal delivery          Maryln Gottron, MD  03/23/2019, 14:48

## 2019-03-23 NOTE — Care Plan (Signed)
Pt resting in bed w/spouse at bedside. Pt rating pain a 0 on a numeric scale. Epidural infusing at 58mL/hr and LR at 143mL/hr. Foley in place and draining clear, yellow urine. Pt instructed to call nurse for assist or if has increased vaginal pressure/pain. Verbalized understanding and had no concerns at this time.   Neta Mends, RN

## 2019-03-23 NOTE — Anesthesia Preprocedure Evaluation (Signed)
ANESTHESIA PRE-OP EVALUATION  Planned Procedure: ANES - LABOR ANALGESIA  Review of Systems     anesthesia history negative     patient summary reviewed  nursing notes reviewed        Pulmonary  negative pulmonary ROS,    Cardiovascular  negative cardio ROS,   ECG reviewed ,No peripheral edema,        GI/Hepatic/Renal   negative GI/hepatic/renal ROS,      Endo/Other   neg endo/other ROS,        Neuro/Psych/MS   negative neuro/psych ROS,      Cancer  negative hematology/oncology ROS,                    Physical Assessment      Patient summary reviewed and Nursing notes reviewed   Airway       Mallampati: II      Neck ROM: full  Mouth Opening: good.            Dental       Dentition intact             Pulmonary    Breath sounds clear to auscultation  (-) no rhonchi, no decreased breath sounds, no wheezes, no rales and no stridor     Cardiovascular    Rhythm: regular  Rate: Normal  (-) no friction rub, carotid bruit is not present, no peripheral edema and no murmur     Other findings            Plan  ASA 2     Planned anesthesia type: epidural              Intravenous induction       Anesthetic plan and risks discussed with patient.      Use of blood products discussed with patient who consented to blood products.     Patient's NPO status is appropriate for Anesthesia.

## 2019-03-24 ENCOUNTER — Encounter (INDEPENDENT_AMBULATORY_CARE_PROVIDER_SITE_OTHER): Payer: Self-pay | Admitting: Obstetrics & Gynecology

## 2019-03-24 LAB — CBC
HCT: 30.9 % — ABNORMAL LOW (ref 34.6–46.2)
HGB: 10.3 g/dL — ABNORMAL LOW (ref 11.8–15.8)
MCH: 28.4 pg (ref 27.6–33.2)
MCHC: 33.3 g/dL (ref 32.6–35.4)
MCV: 85.2 fL (ref 82.3–96.7)
MPV: 10.5 fL — ABNORMAL HIGH (ref 6.6–10.2)
PLATELETS: 160 10*3/uL (ref 140–440)
RBC: 3.63 10*6/uL — ABNORMAL LOW (ref 3.80–5.24)
RDW: 13.1 % (ref 12.4–15.2)
WBC: 12.6 10*3/uL — ABNORMAL HIGH (ref 3.5–10.3)

## 2019-03-24 LAB — SYPHILIS, RAPID PLASMIN REAGIN (RPR), WITH TITER REFLEX: RPR QUALITATIVE: NONREACTIVE

## 2019-03-24 MED ORDER — OXYTOCIN 20 UNITS IN 1000 ML NS IV - ~~LOC~~
999.00 mL/h | INTRAVENOUS | Status: DC
Start: 2019-03-23 — End: 2019-03-24
  Administered 2019-03-23: 23:00:00 0 mL/h via INTRAVENOUS
  Administered 2019-03-23: 22:00:00 999 mL/h via INTRAVENOUS

## 2019-03-24 NOTE — Nurses Notes (Signed)
This patient currently meets requirements for low to mid level nursing care. The patient will continue to be monitored and re-evaluated for any changes in condition. Jermesha Sottile, RN

## 2019-03-24 NOTE — Care Plan (Signed)
Care plan reviewed with patient. Pain management, postpartum care. No questions, complaints or concerns from the patient at this time. Trevor Mace, RN    Problem: Adjustment to Role Transition (Postpartum Vaginal Delivery)  Goal: Successful Maternal Role Transition  Outcome: Ongoing (see interventions/notes)  Intervention: Support Maternal Role Transition  Flowsheets  Taken 03/24/2019 1443  Supportive Measures:   decision-making supported   self-care encouraged  Taken 03/24/2019 0900  Parent/Child Attachment Promotion:   rooming-in promoted   parent/caregiver presence encouraged   participation in care promoted   face-to-face positioning promoted     Problem: Bleeding (Postpartum Vaginal Delivery)  Goal: Hemostasis  Outcome: Ongoing (see interventions/notes)  Intervention: Monitor and Manage Postpartum Bleeding  Flowsheets  Taken 03/24/2019 1443  Syncope Management: position changed slowly  Taken 03/24/2019 0900  Perinatal Bleed Management:   Rh status confirmed   uterine massage provided (delivered)     Problem: Infection (Postpartum Vaginal Delivery)  Goal: Absence of Infection Signs and Symptoms  Outcome: Ongoing (see interventions/notes)  Intervention: Prevent or Manage Infection  Flowsheets (Taken 03/24/2019 0900)  Fever Reduction/Comfort Measures:   lightweight bedding   lightweight clothing  Perineal Care: (done per patient)   absorbent pad changed   perineum cleansed     Problem: Pain (Postpartum Vaginal Delivery)  Goal: Acceptable Pain Control  Outcome: Ongoing (see interventions/notes)  Intervention: Prevent or Manage Pain  Flowsheets (Taken 03/24/2019 1443)  Pain Management Interventions: care clustered     Problem: Urinary Retention (Postpartum Vaginal Delivery)  Goal: Effective Urinary Elimination  Outcome: Ongoing (see interventions/notes)  Intervention: Promote Effective Urinary Elimination  Flowsheets (Taken 03/24/2019 1443)  Urinary Elimination Promotion:   absorbent pad/diaper use encouraged   frequent voiding encouraged     Problem: Breastfeeding  Goal: Effective Breastfeeding  Outcome: Ongoing (see interventions/notes)  Intervention: Promote Breast Care and Comfort  Flowsheets (Taken 03/24/2019 0900)  Breast Care: Breastfeeding: open to air  Intervention: Promote Effective Breastfeeding  Flowsheets  Taken 03/24/2019 1443  Breastfeeding Assistance: feeding on demand promoted  Taken 03/24/2019 0900  Parent/Child Attachment Promotion:   rooming-in promoted   parent/caregiver presence encouraged   participation in care promoted   face-to-face positioning promoted  Intervention: Support Exclusive Breastfeeding Success  Flowsheets  Taken 03/24/2019 1443  Supportive Measures:   decision-making supported   self-care encouraged  Taken 03/24/2019 0900  Breastfeeding Support:   diary/feeding log utilized   maternal nutrition promoted   maternal rest encouraged   maternal hydration promoted

## 2019-03-24 NOTE — Care Plan (Signed)
Patient up in bed nursing infant.  No respiratory distress noted.  Voiding qs.  Performing own pericare.  Breastfeeding well.  VSS.  No c/o. Marin Roberts, RN

## 2019-03-24 NOTE — Anesthesia Postprocedure Evaluation (Signed)
Anesthesia Post Op Evaluation    Patient: Alexis Bowers  ANES - LABOR ANALGESIA    Last Vitals:Temperature: 36.6 C (97.9 F) (03/23/19 1816)  Heart Rate: 74 (03/23/19 2219)  BP (Non-Invasive): (!) 79/60 (03/23/19 2219)  Respiratory Rate: 18 (03/23/19 1236)  SpO2: 90 % (03/23/19 2144)    Patient is sufficiently recovered from the effects of anesthesia to participate in the evaluation and has returned to their pre-procedure level.  Patient location during evaluation: PACU       Patient participation: complete - patient participated  Level of consciousness: awake and alert and responsive to verbal stimuli    Pain management: adequate  Airway patency: patent    Anesthetic complications: no  Cardiovascular status: acceptable  Respiratory status: acceptable  Hydration status: acceptable  Patient post-procedure temperature: Pt Normothermic   PONV Status: Absent

## 2019-03-24 NOTE — Care Plan (Signed)
Patient stable throughout the shift. Vital signs stable. Up ad lib. Medicated for pain as needed and tolerating pain medication. Breastfeeding well with assistance for positioning. Vaginal bleeding is stable. Infant rooming in and caring for infant without problems at this time. No issues or complaints from the patient at this time.  Trevor Mace, RN

## 2019-03-24 NOTE — Nurses Notes (Signed)
Report received from F. Smith, RN.  Assumed care of pt. Madoline Bhatt, RN

## 2019-03-24 NOTE — Progress Notes (Signed)
[place] [id number]  Phone: [phone]       POST-PARTUM PROGRESS NOTE        PATIENT: Alexis Bowers  CHART NUMBER: [id number]  [place] OF SERVICE: 03/24/2019      SUBJECTIVE       Alexis Bowers is a 33 y.o. G1P1001 PPD #1 from an SVD of a single female neonate weighing 3400g at 40 weeks and 1 day gestation. She is breastfeeding. Patient was visited and examined this morning. She was comfortable overnight, reporting minimal pain and light bleeding. She is ambulating without issue, tolerating a regular diet without nausea or vomiting, and voiding/stooling appropriately.       OBJECTIVE     Filed Vitals:    03/24/19 0433 03/24/19 0622 03/24/19 0623 03/24/19 0623   BP:    97/62   Pulse:  62  64   Resp: 16  16    Temp:   36.4 C (97.5 F)    SpO2:  98%         GENERAL: NAD  ABD: Soft, NTTP, bowel sounds + in all four quadrants   FUNDUS: firm, nontender  EXT: No calf tenderness    ASSESSMENT and PLAN     This is a 33 y.o. G1P1001 PPD #1 from an SVD of a single female neonate weighing 3400g at 40 weeks and 1 day gestation.     1. Post-partum Care:   - Ambulating, urinating, tolerating POs.   - Pain well-controlled    Continue with postpartum care. Most likely discharge home in stable condition tomorrow evening with instructions for continuing postpartum care and plans for follow-up in clinic in 4-6 weeks.      Annell Greening, DO-MPH  03/24/2019, 07:06    I saw and examined the patient.  I reviewed the resident's note.  I agree with the findings and plan of care as documented in the resident's note. Any exceptions/additions are edited/noted.    Leanne Lovely, MD  03/24/2019, 09:52

## 2019-03-24 NOTE — Nurses Notes (Signed)
Patient stable throughout shift. SVD at 2140 with second degree repair and labial skids.  Pt ambulating in room. Voiding spontaneously without difficulty. Fundus firm and 3 below umbilicus, bleeding light per patient.  No clots reported.  Pain assessed and treated as needed.  Questions answered.  No further concerns voiced at this time.  Radene Ou, RN

## 2019-03-24 NOTE — Care Plan (Signed)
Problem: Adjustment to Role Transition (Postpartum Vaginal Delivery)  Goal: Successful Maternal Role Transition  Outcome: Ongoing (see interventions/notes)     Problem: Bleeding (Postpartum Vaginal Delivery)  Goal: Hemostasis  Outcome: Ongoing (see interventions/notes)     Problem: Infection (Postpartum Vaginal Delivery)  Goal: Absence of Infection Signs and Symptoms  Outcome: Ongoing (see interventions/notes)     Problem: Pain (Postpartum Vaginal Delivery)  Goal: Acceptable Pain Control  Outcome: Ongoing (see interventions/notes)     Problem: Urinary Retention (Postpartum Vaginal Delivery)  Goal: Effective Urinary Elimination  Outcome: Ongoing (see interventions/notes)     Problem: Breastfeeding  Goal: Effective Breastfeeding  Outcome: Ongoing (see interventions/notes)

## 2019-03-25 ENCOUNTER — Ambulatory Visit: Payer: Self-pay

## 2019-03-25 LAB — URINE CULTURE,ROUTINE: URINE CULTURE: NO GROWTH

## 2019-03-25 MED ORDER — DOCUSATE SODIUM 100 MG CAPSULE
100.0000 mg | ORAL_CAPSULE | Freq: Two times a day (BID) | ORAL | 1 refills | Status: DC | PRN
Start: 2019-03-25 — End: 2023-04-18

## 2019-03-25 MED ORDER — ACETAMINOPHEN 325 MG TABLET
650.0000 mg | ORAL_TABLET | Freq: Four times a day (QID) | ORAL | 1 refills | Status: DC | PRN
Start: 2019-03-25 — End: 2023-04-18

## 2019-03-25 MED ORDER — IBUPROFEN 800 MG TABLET
800.0000 mg | ORAL_TABLET | Freq: Three times a day (TID) | ORAL | 1 refills | Status: DC | PRN
Start: 2019-03-25 — End: 2023-04-18

## 2019-03-25 NOTE — Lactation Note (Signed)
This note was copied from a baby's chart.  Met with mom prior to delivery and talked with her about breastfeeding. Gave her some links to videos to watch. Today came in, baby was nursing and latch appeared to be good. Mom states latch was comfortable. I talked to mom about how to tell latch was good, including ear and jaw movement, more areola showing at the top than at the bottom. We discussed normal 2nd night behavior and I told her baby would probably cluster feed and become fussy tonight.  I talked about baby led feeding and offering both breast with a feeding. Mom asked about pacifer use and I told the current recommendations were to wait until breastfeeding was well established, usually at least 2 to 3 weeks. Mom given opportunity to ask questions.

## 2019-03-25 NOTE — Discharge Instructions (Signed)
Breastfeeding: Caring for Yourself  When you have a new little person in your life, it's easy to forget about yourself. There are new demands on your time. There are also new responsibilities. But it's important to take care of yourself. This will help you take better care of your new baby.     Healthy habits  Here are some healthy tips:  Get exercise when you can. If you leak milk, it will help to nurse right before the activity.  Avoid smoking. Smoking is unhealthy for you and may cause you to make less milk. Secondhand smoke is also harmful to your baby.  Talk to your healthcare provider about alcohol, if you choose to drink.  When you're sick, tell your healthcare provider that you are breastfeeding. Few medicines and illnesses affect breastfeeding, but it is important to check.  Ask your healthcare provider before taking any prescription or over-the-counter medicines, herbs, or supplements.  Comfy clothes  Suggestions for being comfortable when breastfeeding include:  Find a comfortable nursing bra. Many women find underwire uncomfortable. Some stores offer on-site fittings. Ask your healthcare provider or nurse for a referral.  If you have leaking milk, place breast pads inside your bra.  Choose an extra-supportive bra for exercise. Or you can wear two bras at the same time for more support.  Wear loose tops that can be lifted for breastfeeding. You can also buy clothes specially made for breastfeeding moms.  A note about sex  After delivery, it may take a while before your interest in sex returns. Share your feelings with your partner. Your healthcare provider will let you know when it is safe to resume having sex. When you're ready, know that:  There are several forms of birth control that can be used while breastfeeding. Ask your healthcare provider what to use for pregnancy prevention while you are nursing.  Breastfeeding hormones may cause vaginal dryness. Some women find using a water-based lubricant  makes sex more comfortable.  Milk may let down when you are aroused. Applying pressure on the nipple, using breast pads, or a towel may help with this.  When to call your healthcare provider  Call your healthcare provider if:  You feel overwhelmed and don't know where to turn.  You feel very sad or don't want to be with your baby.  You feel like your baby cries all the time and won't be soothed  You are unable to exercise, or have sex, without discomfort.  You are unsure about a medicine, illness, or activity and its effect on breastfeeding.   Date Last Reviewed: 12/14/2013   2000-2017 [employer] [address], [place] [address]. All rights reserved. This information is not intended as a substitute for professional medical care. Always follow your healthcare professional's instructions.        Nutrition While Breastfeeding  Do I need a special diet for breastfeeding?  You don't have to eat a special diet to produce enough milk for your baby. Also, your milk will be of good quality for your baby regardless of what you eat. However, your body needs fuel to make breastmilk, so eat your fill of a variety of foods. Breastfeeding isn't an excuse to eat and drink everything you want, but it's not a reason to avoid favorite foods either.   Healthy diet for the new mother  A healthy diet is recommended for all women and offers many benefits to the new mother. Choosing a variety of healthy foods   creates a pattern for the entire family, and each family member benefits. Women who are breastfeeding need about 500 extra calories per day. Some women might need more, while others might need less. When choosing foods, use the nutrition chart below as a guide.    Bread, cereal, rice, and pasta Vegetables Fruit   Milk, yogurt, and cheese Meat, poultry, fish,  dry beans, eggs, and nuts Fats, oils, and sweets  (use sparingly)   What's good for you?  Here are some things to do:  Breastfeeding women need to drink  when they feel thirsty. There is no specific amount of water you need to drink to make enough milk.  Follow healthy eating guidelines.  Snack on fruit or low-fat dairy products if you're hungry between meals.  If your healthcare provider recommends it, keep taking prenatal vitamins.  What's not good for you?  Here are other things to consider:  Limit fatty foods and foods that are high in sugar (cookies, cakes).  Be aware that what enters your body may pass into your breastmilk. Limit caffeine. It is not just in coffee, but is also in cola, tea, and chocolate.  Talk with your healthcare provider before taking any medicines. It is important to let your healthcare provider know that you are nursing. Some medicines are not safe with breastfeeding.  Remember: alcohol, cigarettes, and drugs also affect your breastmilk and your baby. Talk with your healthcare provider.  Date Last Reviewed: 12/14/2013   2000-2017 [employer] [address], [place] [address]. All rights reserved. This information is not intended as a substitute for professional medical care. Always follow your healthcare professional's instructions.        Breastfeeding FAQs  What Should I Do If My Breasts Become Swollen, Tender, or Sore?  These symptoms are most often due to engorgement of the breasts from being too full of milk. You are making more milk than your baby is drinking. This may cause pain and make it harder for your baby to nurse. If this happens, try the following:  Keep nursing. This is a temporary condition and improves once you learn how to remove excess milk.  Express some milk before you breastfeed, either manually or with a breast pump.  Use a warm compress (towel soaked in warm water). Or take a warm shower before feeding. If this doesn't give relief, try an ice pack.  Acetaminophen may help with continued pain and is safe to take occasionally during breastfeeding.  If your nipples or breasts continue to hurt,  contact your healthcare provider. Nipple and breast problems need to be evaluated and treated as soon as possible.  Please do not get discouraged and stop nursing.    When to seek medical advice  Call your own healthcare provider right away if you:  Have a fever of 100.4F (38C) or higher, or as directed by your provider.  Have redness, warmth, pain, or unusual discharge from your breasts.  Have such painful nipples and breasts that you want to stop nursing.  Have a hard lump in your breast.  Have lower abdominal pain or cramping.  Have pain or burning when you pass urine.  Have unexpected vaginal bleeding or foul-smelling discharge.  Date Last Reviewed: 11/05/2013   2000-2017 [employer] [address], [place] [address]. All rights reserved. This information is not intended as a substitute for professional medical care. Always follow your healthcare professional's instructions.          Expressing Your Milk  Work, school, or even a late-night movie can require you to be away from your baby. This doesn't mean you have to give up breastfeeding. Feeding your baby from your breasts is ideal. If you must be away from your baby, you can express milk from your breast. Talk with your healthcare provider about the best ways to feed expressed breastmilk to your infant. But remember, don't give your baby bottles or pacifiers until he or she is at least 4 to 6 weeks old. This helps you both get a good start on breastfeeding. Your baby can get used to your natural nipple first.      Expressing by hand Expressing with double pump      Always wash your hands before expressing milk from your breast.   Stimulating letdown  Hold a washcloth under very warm water and wring it out.  Place one warm washcloth over each breast to warm them.   Gently massage your breasts to stimulate the milk flow.  Start under the arm and move around the entire breast.   Use the backs of your fingernails to gently scratch the skin  of your breasts downward from the outside toward your nipples.   If you're away from your baby, looking at your baby's picture can help your milk let down.  Expressing by hand or pump  Your lactation consultant can help you choose the best method for your needs. Here are some tips:  Expressing by hand reduces pressure in swollen or leaky breasts. It may be a good way to start a pumping session. If you need to give expressed milk to your baby in the first few days after delivery, hand expression can often help get more colostrum than using a pump. Ask your nurse or midwife to teach you how to hand express.  Start expressing within 6 hours of separation from your baby in the hospital.  When separated from your baby, it is best to express as often and as long as a baby would breastfeed. Newborns feed 8 to 12 times each 24 hours.  A pump gently pulls your nipple into the cup like a baby's suck and can be the fastest way to express after your milk comes in. Pumps come in manual, battery-operated, and electric styles. To protect your breasts and the milk you pump, follow the instructions that come with your pump.  For sick or premature babies who aren't feeding at the breast, "hands-on pumping" is a special way to help be sure you make enough milk. Hands-on pumping involves a combination of both hand expression and an electrical pump.   Hand expressing while using a pump can increase the amount of milk you can pump. It can also increase the fat content of the pumped milk.  You can usually buy or rent a pump from a drugstore or medical equipment store. Check with your hospital to find out where you can buy or rent a pump.  Working and breastfeeding  Breastfeed your baby all of the time during your maternity leave. This helps set up your milk supply for the whole year.  When your baby is about 2 weeks old, start pumping after you feed the baby. You can freeze this expressed milk. It will help you build a supply for going  back to work. Nursing plus pumping will help your breasts make more milk. Feeding your baby from your breasts is ideal. If you must be away from your baby, you can express   milk from your breast. Talk with your healthcare provider about the best ways to feed expressed breastmilk to your infant.    Express milk during work breaks. This helps protect your milk supply. It also helps prevent engorged or leaking breasts.  Arrange to breastfeed at lunch if your childcare is nearby. If not, be sure to pump during your lunch break.  Breastfeed before you leave for work and soon after you return home. Your partner may be able to make dinner while you feed the baby.  Breastfeed at night and on weekends. This will keep up your milk supply. Talk to your healthcare provider about the best ways to feed expressed breastmilk to your infant.  Date Last Reviewed: 06/08/2015   2000-2018 [employer] [address], [place] [address]. All rights reserved. This information is not intended as a substitute for professional medical care. Always follow your healthcare professional's instructions.            Storing Expressed Milk    You can express your milk and store it in clean containers. Your family or a sitter can feed it to the baby. This way, your baby gets the benefits of your milk even when you can't be there at feeding time.  Type of storage Storage times   Room temperature     At room temperature (up to 78F or 26C)  Tip: Keep the container clean, covered, and cool. 3 to 4 hours is best; 6 to 8 hours is acceptable under very clean conditions   Refrigerator     In a refrigerator (less than 39F or less than 4C)  Tip: Place milk in the back of the main section of the refrigerator. 72 hours is best; up to 8 days is acceptable under very clean conditions   Freezer      In a freezer (0F or -17C)  Tip: Store milk toward the back of the freezer. 6 months is best; 12 months is acceptable   Guidelines for milk  storage  Always use a clean container to collect and store milk. Never pour warm expressed milk into a bottle with cold milk. And be sure to label and date each bottle or bag of milk. To store milk safely, see the chart above.  Warming stored milk  Thaw frozen milk in the refrigerator or in a bowl of warm water. It's a good idea to warm refrigerated milk before using it. For your baby's safety:  Use the oldest milk first.  Warm a container of milk by putting it in a bowl of warm (not hot) water for a few minutes. Or use a bottle warmer set on low.  Gently swirl the milk to mix it. Then place a few drops on your wrist. The milk should be near room temperature.  Don't put the milk in a microwave. This could create pockets of hot liquid that can burn your baby's mouth.  Date Last Reviewed: 06/08/2015   2000-2018 [employer] [address], [place] [address]. All rights reserved. This information is not intended as a substitute for professional medical care. Always follow your healthcare professional's instructions.      - No driving while on narcotic pain medication.  - No sex, tampons, douching, or anything in the vagina for 6 weeks.  - No school, work, sports, heavy lifting, or strenuous activity for 6 weeks.  - You may shower. No tub baths for 2 weeks or until bleeding has stopped.          O.B. INSTRUCTIONS       Call [phone] and ask for the OB physician on call for any of the following symptoms:    Fever of 100.2 or higher for 2 or more hours.  Red, warm, painful area in either breast.  Urgency, frequency, burning, pain on urination or urinating small amounts.   Warm, tender area on either leg.  Purulent discharge from incision or vagina.  Feelings of depression, irritability, or anxiety.  Headaches, blurred vision, dizziness, excessive swelling, or epigastric pain.        After a Vaginal Birth    After having a baby, your body may be very tired. It can take time to recover from a  vaginal delivery. You may stay in the hospital or birth center from 1 to 4 days. In some cases, you may be able to go home the same day.  Right after the delivery  Your temperature and blood pressure will be taken until they are stable. A nurse or other healthcare provider will observe you as you rest. You may have afterbirth pains. These are cramps caused by the uterus shrinking. Sanitary pads are used to soak up the discharge of the uterine lining. To make sure that you aren't bleeding too much, the pad will be checked. And the firmness of your uterus will be checked. To do this, a nurse will gently push down on your stomach. If you had anesthesia, you'll be watched closely until you can feel and move your toes. If you have perineal pain (pain between the vagina and anus), an ice pack can help.    Preparing to go home  You may be anxious to go home as soon as possible. Before you and your baby go home, a healthcare provider will check to be sure you are healthy enough to take care of your baby and yourself. You're ready to go home when:  You can walk to the bathroom and use the bathroom without help.  You can eat solid food and swallow pills (if needed).  You have no sign of infection or other health problems, including fever.   You have adequate pain control.  Your bleeding isn't excessive.  You are able to care for your newborn and are emotionally stable.  Before leaving the hospital or birth center, you'll be given written instructions for home self-care after vaginal delivery. Be sure to follow these instructions carefully. If you have questions or concerns, talk about them now.  If you have stitches  You may have received stitches in the skin near your vagina. The stitches might have closed an episiotomy (an incision that enlarges the opening of the vagina). Or you may have needed stitches to repair torn skin. Either way, your stitches should dissolve within weeks. Until then, you can help reduce discomfort,  aid healing, and reduce your risk of infection by keeping the stitches clean. These tips can help:  Gently wipe from front to back after you urinate or have a bowel movement.  After wiping, spray warm water on the area. Or you can have a sitz bath. This means sitting in a tub with a few inches of water in it. Then pat the area dry or use a hairdryer on a cool setting.  Do not use soap or any solution except water on the area.  You can take a shower unless told not to.  Change sanitary pads at least every 2 to 4 hours.  Place cold or heat packs on the   area as directed by your healthcare providers or nurses. Keep a thin towel between the pack and your skin.  Sit on firm seats so the stitches pull less.  Postnatal follow-up  Schedule a postnatal follow-up exam with your healthcare provider for about 6 weeks after delivery. During this exam, your uterus and vaginal area will be checked. Contact your healthcare provider if you think you or your baby are having any problems.  When to call your healthcare provider  Call your healthcare provider right away if you have:  A fever of 100.4F (38.0C) or higher  Bleeding that needs a new sanitary pad after an hour, or large blood clots  Pain in your vagina that gets worse and isn't relieved with medicine  Swelling, discharge, or increased pain from vaginal tear or episiotomy  Burning, pain, red streaks, or lumpy areas in your breasts that may be accompanied by flu-like symptoms  Cracks, blisters, or blood on your nipples  Burning or pain when you urinate  Nausea or vomiting  Dizziness or fainting  Feelings of extreme sadness or anxiety, or a feeling that you don't want to be with your baby  Belly pain that isn't relieved with medicine  Vaginal discharge that has a bad odor  No bowel movement for 5 days  Painful urination, or inability to control urination  Redness, warmth, or pain in the lower leg  Chest pain   Date Last Reviewed: 03/09/2016   2000-2018 [employer] [address], [place] [address]. All rights reserved. This information is not intended as a substitute for professional medical care. Always follow your healthcare professional's instructions.          After Episiotomy  Your healthcare provider either done an episiotomy or repaired tissue that was torn during your baby's birth. An episiotomy is a cut (incision) made to make the opening of the vagina larger. The provider used stitches to repair the skin in or near your vagina. The stitches will dissolve on their own within a few weeks. They don't need to be removed by your healthcare provider.  Avoid infection  Lower the risk of infection by keeping your stitches clean:  Gently wipe from front to back after you have a bowel movement.  After wiping, spray warm water on the stitches. Pat dry.  After urination, it's OK not to wipe. Just spray with warm water and then pat dry.  Don't use soap or any solution except water unless your healthcare provider recommends it.  Change sanitary pads at least every 2 to 4 hours.  Avoid constipation  Follow these suggestions:  Eat fresh fruits and vegetables, whole grains, and bran cereals.  Drink 6 to 8 glasses of water every day, unless directed otherwise.  Don't strain to have a bowel movement.  Ask your healthcare provider about using a stool softener.  If you are breastfeeding, ask your healthcare provider before you take any medicine.  Ease pain  Try to make yourself more comfortable by:  Sitting in a warm bath (sitz bath).  Placing cold packs or heat packs on your stitches. Keep a thin towel between the pack and your skin.  Sitting on a firm seat so that the stitches pull less.  Using medicated spray as ordered by your healthcare provider.  Talking to your healthcare provider about using an anti-inflammatory medicine like ibuprofen to ease the pain.  Follow-up  Make a follow-up appointment      When to call your healthcare   provider  Call your healthcare provider  right away if you have any of the following:  Clots of blood the size of a quarter or larger passing continually from your vagina  Heavy or gushing bleeding from your vagina  Smelly discharge from your vagina  Severe pain in the stomach or increased pain near your stitches  Fever of 100.4F (38C) or higher, or as directed by your healthcare provider  Shaking chills  No bowel movement within 1 week after the birth of your baby  Trouble urinating, or pain or urgency with urination  Stitches that come out or pieces of stitches passing from your vagina   Date Last Reviewed: 11/22/2013   2000-2017 [employer] [address], [place] [address]. All rights reserved. This information is not intended as a substitute for professional medical care. Always follow your healthcare professional's instructions.            Incision Care After Vaginal Birth  After your baby's birth, you may have needed stitches in the skin near your vagina. The stitches might have closed an episiotomy (a cut that enlarges the opening of the vagina). Or you may have needed stitches to repair torn skin. Either way, your stitches should dissolve within weeks. Until then, use this handout as a guide to help ease any discomfort and aid healing.  Keep clean  You can reduce your risk of infection by keeping the area around the stitches clean. These hints can help:  Gently wipe from front to back after you urinate or have a bowel movement.  After wiping, spray warm water on the stitches. Pat dry. If you are too sore, just spray the area after urination and then pat dry without wiping.   Do not use soap or any solution except water unless instructed by your healthcare provider.  Change sanitary pads at least every 2 to 4 hours.  Eat to stay regular  Having bowel movements is easier if you're not constipated. Follow these tips:  Eat fresh fruit and vegetables, whole grains, and bran cereals.  Drink plenty of water.  Don't strain to have  a bowel movement.  Ask your healthcare provider about using a stool softener. If you are breastfeeding, ask before you take any medicine.  Reduce your discomfort  Here are some tips to make you more comfortable:  Sit in a warm bath (sitz bath).  Place cold or heat packs on your stitches. Keep a thin towel between the pack and your skin.  Sit on a firm seat so the stitches pull less.  Use medicated spray as ordered by your healthcare provider.  Call your healthcare provider   Call your healthcare provider if you have:  Heavy or gushing bleeding from the vagina  Discharge that has a bad odor  Severe pain in the abdomen or increased pain near your stitches  If your episiotomy or tear opens  Fever or chills  No bowel movement within 1 week after the birth of your baby  Pain or urgency with urination, or inability to urinate   Date Last Reviewed: 11/08/2013   2000-2017 [employer] [address], [place] [address]. All rights reserved. This information is not intended as a substitute for professional medical care. Always follow your healthcare professional's instructions.        Taking a Sitz Bath  A sitz bath is a type of therapy done by sitting in warm, shallow water. It can help soothe pain,   itching, and other symptoms in the anal and genital areas. It can also help keep these areas clean if you can't take a bath or shower. Sitz is from the German word sitzen, which means to sit.  Why a sitz bath is done  A sitz bath helps clean and treat certain problems in the anal area, genital area, and the perineum. The perineum is the area between the anus and the vulva in women. In men, it is between the anus and the scrotum. A sitz bath helps increase blood flow to these areas and relax the muscles.  A sitz bath may be done to:  Help ease pain and itching from hemorrhoids  Help ease pain from an anal fissure  Bathe and soothe the perineum after childbirth  Clean and soothe the anal area or perineum after  surgery  Ease prostate pain during prostatitis or after a procedure  Help ease period cramps  Clean the anal and genital areas if you can't take a bath or shower  How a sitz bath is done  A sitz bath can be done in 2 ways: in a bathtub or using a sitz bath bowl.  In a bathtub  To take a sitz bath in a tub:  Make sure your bathtub is clean. Fill a clean bathtub with 3 to 4 inches of warm water. In some cases, your healthcare provider may tell you to use cold water instead.  Add salt or medicine to the water if advised by your healthcare provider.  Gently lower yourself down into the bathtub and sit on the bottom of the tub. Don't get into the bath unless the water temperature is comfortable.  Hold on to a railing. Or ask for help from a family member, friend, or caregiver if needed.  If you have a wound, the water may cause pain at first. But the pain should ease. Make sure the area that needs treating is under the water. You can bend your knees up to help expose the area that needs contact with the water.  Using a sitz bath bowl  A sitz bath bowl is a special plastic container that's placed on a toilet. You can buy this in many drugstores and medical supply stores. To take a sitz bath this way:  Lift the toilet lid and seat. Place a cleaned plastic sitz bath bowl on the rim of your toilet. Make sure the bowl is firmly in place and won't move around.  Fill the sitz bath bowl with warm water from a pitcher or other container. The water should cover your perineum. Make sure the water temperature is comfortable.  Add salt or medicine to the water if advised by your healthcare provider. Or follow the package instructions about how to fill the bowl. Some kits come with a plastic bag and tubing. This lets you stream water into the bowl and at the area of your body that needs treatment. The bowl may have a slot or hole in the back. This lets water flow out so it doesn't overflow onto the floor. If there is no hole, be  careful not to fill the bowl too full.  Gently sit down on the sitz bath bowl. Hold on to a railing. Or ask for help from a family member, friend, or caregiver if needed.  If you have a wound, the water may cause pain at first, but the pain should ease. Make sure the area that needs treating is under the water.  For any   type of sitz bath:  Sit in the water for 10 to 20 minutes.  Add more warm water as needed to keep the water comfortable.  Get up slowly from the tub or toilet. You may feel lightheaded or dizzy. Hold on to a railing. Or ask for help from a family member, friend, or caregiver if needed.  Gently pat your anal area, perineum, and genitals dry with a clean towel. Don't rub the area.  Wash your hands. Put any ointment or cream on the area, if advised.  Wash the bathtub or sitz bath bowl with soap and water after each use.  Use a sitz bath 2 to 3 times a day, or as often as your healthcare provider advises.  When to call your healthcare provider  Call your healthcare provider right away if you have any of these:  Fever of 100.4F (38C) or higher, or as directed  Redness, swelling, or fluid leaking from a cut (incision) that gets worse  Pain that gets worse  Symptoms that don't get better, or get worse  New symptoms   Date Last Reviewed: 08/08/2014   2000-2017 [employer] [address], [place] [address]. All rights reserved. This information is not intended as a substitute for professional medical care. Always follow your healthcare professional's instructions.        Understanding Postpartum Depression  You've just had a baby. You know you should be excited and happy. But instead you find yourself crying for no reason. You may have trouble coping with your daily tasks. You feel sad, tired, and hopeless most of the time. You may even feel ashamed or guilty. But what you're going through is not your fault and you can feel better. Talk to your healthcare provider. He or she can  help.    What is depression?  Depression is a mood disorder that affects the way you think and feel. The most common symptom is a feeling of deep sadness. You may also feel as if you just can't cope with life. Other symptoms include:  Gaining or losing a lot of weight  Sleeping too much or too little  Feeling tired all the time  Feeling restless  Crying a lot  Having too little or too much appetite.  Withdrawing from friends and family  Having headaches, aches and pains, or stomach problems that won't go away.  Fears of harming your baby  Lack of interest in your baby  Feeling worthless or guilty  No longer finding pleasure in things you used to  Having trouble thinking clearly or making decisions  Thinking about death or suicide   Depression after childbirth  You may be weepy and tired right after giving birth. These feelings are normal. They're sometimes called the "baby blues." These blues go away after 2 or 3 weeks. However, postpartum (meaning "after birth") depression lasts much longer and is more severe than the "baby blues." It can make you feel sad and hopeless. You may also fear that your baby will be harmed and worry about being a bad mother.  What causes postpartum depression?  The exact cause of postpartum depression is unknown. Changes in brain chemistry or structure are believed to play a big role in depression. It may be due to changes in your hormones during and after childbirth. You may also be tired from caring for your baby and adjusting to being a mother. All these factors may make you feel depressed. In some cases, your genes   may also play a role.  Depression can be treated  The good news is that there are many ways to treat postpartum depression. Talking to your healthcare provider is the first step toward feeling better.  Resources  National Institute of Mental Health866-615-6464www.nimh.nih.gov  National Alliance on Mental Illness800-950-6264www.nami.org  Mental Health  [place]800-969-6642www.nmha.org  National Suicide Hotline800-784-2433 (800-SUICIDE)   Date Last Reviewed: 11/22/2013   2000-2017 [employer] [address], [place] [address]. All rights reserved. This information is not intended as a substitute for professional medical care. Always follow your healthcare professional's instructions.          After Giving Birth: How to Feel Healthy    Helping yourself feel fit is one of the best things you can do for your baby. A little exercise will tone your muscles. You'll feel stronger and more energized. You'll also feel more awake and aware. Don't worry about your weight right now. Your goal is to feel healthy. Part of feeling good is dressing for comfort. If you dress "smart," you can be a busy new mom and still look great.  Continue Kegel exercises  You may have been told to do Kegel exercises during pregnancy. These exercises strengthen the muscles that are strained by carrying and delivering the baby. You can return to your Kegels as soon as you feel ready. Why not start today? Squeeze your pelvic floor muscles (the ones that control your urine stream) for at least 5 seconds. Relax, then squeeze again. Work your way up to 50 or 100 Kegels a day.  Exercise often  Exercise helps you get in shape. It also strengthens your muscles, so you are better fit for lifting the baby. As an added benefit, exercise gives you a sense that you're doing something good for yourself. Take your baby for a short walk, or spend 10 minutes stretching. If you were active during pregnancy, you can probably begin light exercise as soon as you feel ready. But be sure to check with your healthcare provider before you begin.  Stay off the scale  For the first month, think about regaining energy and feeling good, not about losing weight. Losing weight too soon can make you feel more tired. Instead, focus on caring for your baby and eating balanced meals. You may lose some weight  without even trying, especially if you're breastfeeding. Once your energy level is back to normal, you can begin to lose weight. A gradual weight loss of 4 or 5 pounds a month is safest.  Dress smart  You'll want to be comfortable during the first days after delivery. Wear a robe, pajamas, or sweats -- whatever feels best. Soon you may want to look more like your prepregnant self. Do your hair and wear makeup, if you normally do. A loose-fitting dress may feel good. But do yourself a favor: Don't reach for your jeans. It's likely to be a month or more before you can wear them. If leaking breasts are a problem, put pads inside your bra and dress in layers. If you're breastfeeding, shirts that open in front or pullover tops are good choices. A scarf or shawl can be used as a drape if you breastfeed when others are present.  When to call your healthcare provider  Remember to schedule your postpartum visit. If you delivered by cesarean, be seen within 2 weeks. For vaginal delivery, be seen 4 to 6 weeks after the birth. Also, call your healthcare provider if you have:    Heavy bleeding  Fever  Redness or persistent lump in breasts  Inability to void or have a bowel movement after 1 week  Severe pain  Worsening depression   Date Last Reviewed: 05/10/2016   2000-2018 [employer] [address], [place] [address]. All rights reserved. This information is not intended as a substitute for professional medical care. Always follow your healthcare professional's instructions.        After Giving Birth: Changing Expectations for Parents     Outings with your baby will help form new family bonds.     Congratulations on your new baby! Diapers won't be the only thing you'll change in the months ahead. Your sense of yourself and how you relate to your partner will also be different. If you have other children, expect some emotional swings, as you and your family try out your new roles.  Not always rosy  Most new  mothers experience some form of the baby blues. These mood swings are caused by hormonal shifts in your body. Stress due to the recent changes in your life and lack of sleep also have an effect. The baby blues may last a few days or up to 2 weeks. You may feel a sense of loss, frustration, or anger. Or you may be sad that having a baby isn't what you'd imagined. Sometimes a birth triggers childhood memories or reminds you about the death of a loved one.  Balancing the blues  Recognize your need to talk, to feel protected, to have private time. Allow yourself to cry, to sit, to think. Ask for help when you need it, and accept help when it's offered. Knowing your needs is not a weakness. Share your thoughts with your partner. Or pick up the phone and call a friend, your mother, a sister, or an aunt. Rest, eat right, and get some light exercise. The mind feels best when the body feels good.  Shaping your family  Over the next year, your household will go through many changes. If this is your first child, you and your partner will have to adjust to the idea of being a family. If you have older children, help them adjust to the new baby. Sharing chores, time, and attention is something you'll all need to work on. If you're a single mother, you may find that your baby has a "family" of friends as well as relatives.  Share activities  As a newborn, your baby has many needs. These must be blended into the family's style. Take the baby on outings, so he or she is part of the family from the beginning. Involve everyone in activities you all can enjoy doing together.  As parents and lovers  The demands on your relationship have just increased. So do your best to strengthen your partnership ties. Set aside time to talk every day. Put away the dinner dishes together or take a break before bedtime. Also, try to spend time alone. It will help you remember why you're together. Return to sex when it feels right and it's OK with your  healthcare provider. But don't think of breastfeeding as a form of birth control. Instead, talk with your healthcare provider about birth control methods that might be right for this time in your life.  When to call your healthcare provider  Call your healthcare provider if you have any of the following concerns:  You don't want to be with the baby.  You have no interest in eating   or are not able to sleep.  Your symptoms are not getting better, and you're getting more upset.  You think you may harm yourself or the baby.  The Depression After Delivery hotline (800-944-4773) may also be helpful.   Date Last Reviewed: 05/10/2016   2000-2018 [employer] [address], [place] [address]. All rights reserved. This information is not intended as a substitute for professional medical care. Always follow your healthcare professional's instructions.                Birth Control Methods  Birth control methods are used to help prevent pregnancy. There are many different methods to choose from. Talk to your healthcare provider about which method is right for you. Be sure to ask your provider about the effectiveness of each method. Also ask about the benefits, risks, and side effects of each method.  Hormones  Some birth control methods work by releasing hormones such as progestin and estrogen. These methods include hormone implants, hormone shots, the vaginal ring, the patch, and birth control pills. They all work by stopping ovulation (release of the egg from the ovary). The implant is a small device that needs to be placed in the upper arm by a trained healthcare provider. It works for up to 3 years. Hormone injections must be repeated every 3 months. The vaginal ring must be replaced monthly (it can be removed during the fourth week of each cycle). The patch must be replaced weekly (it is not worn during the fourth week of each cycle). Birth control pills must be taken every day. All of these methods  are effective and can be stopped at any time.  Intrauterine device (IUD)  An IUD is a small, T-shaped device. It must be placed in the uterus by a trained healthcare provider. There are different types of IUDs available. They work by causing changes in the uterus that make it harder for sperm to reach the egg. Depending on the type of IUD you have, it may work for several years or longer. The IUD is a reversible birth control method. This means it can be removed at any time.  Condom  A condom is a sheath that forms a thin barrier between the penis and the vagina. It helps prevent pregnancy by keeping sperm from entering the vagina. When latex condoms are used, they have the added benefit of protecting against most STIs (sexually transmitted infections). Condoms are used each time there is sexual intercourse and should be discarded after each use. Ask your healthcare provider about the different types of condoms available. These include both the female condom and female condom.  Spermicide  Spermicides come as foams, jellies, creams, suppositories, and tablets.  They help prevent pregnancy by killing sperm. When used alone they are not that reliable. They work best when combined with other birth control methods such as diaphragms and cervical caps.  Sponge, diaphragm, and cervical cap  All of these methods help prevent pregnancy by covering the opening of the uterus (cervix). This prevents sperm from passing through.  The sponge contains spermicide. It can be bought over the counter. The sponge must be left in place for at least 6 hours after the last time you have sex. However, it should not stay in place for more than 24 hours. It should be discarded after it is used.  The diaphragm and cervical cap must be fitted and prescribed by your healthcare provider. Both are used with spermicide. The diaphragm   must be left in place for at least 6 hours after sex. However, it should not stay in place for more than 24 hours. It  can be washed and reused. The cervical cap must be left in place for at least 6 hours after sex. However, it should not stay in place for more than 48 hours. It can be washed and reused.  Withdrawal method  This is when the man pulls his penis out of the vagina just before ejaculation ("coming"). This lowers the amount of sperm entering the vagina. Be aware that fluids released just before ejaculation often still contain some sperm, so this method is not as reliable as certain other methods.  Rhythm method  This method requires that you know when in your menstrual cycle you are likely to become pregnant. Then, you avoid sex during those days. This requires careful planning and good discipline. Your healthcare provider can explain more about how this works.  Tubal ligation and vasectomy  These are surgical methods to prevent pregnancy. Tubal ligation is an option for women. The fallopian tubes are blocked or cut (ligated). This keeps the egg from passing into the uterus or sperm from reaching the egg. Vasectomy is an option for men. The tubes that normally carry sperm to the penis are either closed or blocked. Both tubal ligation and vasectomy are permanent birth control methods. This means reversal is either not possible or unlikely to work. They are good choices for women and men who know that they do not want to have children in the future.  Date Last Reviewed: 02/08/2016   2000-2017 [employer] [address], [place] [address]. All rights reserved. This information is not intended as a substitute for professional medical care. Always follow your healthcare professional's instructions.

## 2019-03-25 NOTE — Care Plan (Signed)
Problem: Adjustment to Role Transition (Postpartum Vaginal Delivery)  Goal: Successful Maternal Role Transition  Outcome: Ongoing (see interventions/notes)  Intervention: Support Maternal Role Transition  Flowsheets (Taken 03/25/2019 0747)  Parent/Child Attachment Promotion: cue recognition promoted  Supportive Measures: positive reinforcement provided     Problem: Infection (Postpartum Vaginal Delivery)  Goal: Absence of Infection Signs and Symptoms  Outcome: Ongoing (see interventions/notes)  Intervention: Prevent or Manage Infection  Flowsheets  Taken 03/25/2019 0747 by Neta Mends, RN  Fever Reduction/Comfort Measures:   lightweight bedding   lightweight clothing  Infection Management: aseptic technique maintained  Taken 03/24/2019 1600 by Marin Roberts, RN  Perineal Care: (and demonstrated) sitz bath given     Problem: Pain (Postpartum Vaginal Delivery)  Goal: Acceptable Pain Control  Outcome: Ongoing (see interventions/notes)  Intervention: Prevent or Manage Pain  Flowsheets (Taken 03/24/2019 1443 by Trevor Mace, RN)  Pain Management Interventions: care clustered     Problem: Breastfeeding  Goal: Effective Breastfeeding  Outcome: Ongoing (see interventions/notes)  Intervention: Promote Breast Care and Comfort  Flowsheets (Taken 03/25/2019 0747)  Breast Care: Breastfeeding: open to air  Intervention: Promote Effective Breastfeeding  Flowsheets (Taken 03/25/2019 0747)  Breastfeeding Assistance:   support offered   feeding on demand promoted   feeding cue recognition promoted   assisted with positioning  Parent/Child Attachment Promotion: cue recognition promoted  Intervention: Support Exclusive Breastfeeding Success  Flowsheets (Taken 03/25/2019 0747)  Supportive Measures: positive reinforcement provided  Breastfeeding Support: diary/feeding log utilized

## 2019-03-25 NOTE — Discharge Summary (Signed)
[place] [address]  Phone: [phone]  Fax: [phone]         OB DISCHARGE SUMMARY           PATIENT NAME:  Alexis Bowers   MRN: [id number]   DOB:  10/14/1985     ADMISSION DATE:  03/23/2019    DISCHARGE DATE:  03/27/2019     ATTENDING PHYSICIAN: Lyman Speller, MD    PRIMARY CARE PHYSICIAN: No Pcp     CHIEF COMPLAINT     Chief Complaint   Patient presents with   . Pregnancy Labor       DISCHARGE DIAGNOSIS     Principle Problem:   Patient Active Problem List    Diagnosis   . Normal labor   . Blood type O+   . Pregnancy, first       Discharge Medications:     Current Discharge Medication List      START taking these medications.      Details   acetaminophen 325 mg Tablet  Commonly known as: TYLENOL   650 mg, Oral, EVERY 6 HOURS PRN  Qty: 120 Tab  Refills: 1     docusate sodium 100 mg Capsule  Commonly known as: COLACE   100 mg, Oral, 2 TIMES DAILY PRN  Qty: 60 Cap  Refills: 1     Ibuprofen 800 mg Tablet  Commonly known as: MOTRIN   800 mg, Oral, EVERY 8 HOURS PRN  Qty: 90 Tab  Refills: 1        STOP taking these medications.    ONE A DAY [facility] ORAL            Discharge Instructions:      DISCHARGE INSTRUCTION - DIET     Diet: RESUME HOME DIET      FOLLOW-UP: OB/GYN - [place], [place]     Follow-up in: 4 WEEKS 4-6 weeks   Reason for visit: HOSPITAL DISCHARGE    Follow-up reason: Post-partum visit    Provider: Lyman Speller, MD        Follow-up Information     Ob[facility]Gyn Clinic, [facility] .    Specialty: Ob-Gyn  Contact information:  [place] [address]  [phone]  Additional information:  Below are driving directions to the [facility], [place], [place]. If you need any additional information, please call [phone] or [phone]. You may also visit our website at [email or web address] *From [place]: Follow [address] towards [place]. Take exit [address], [employer].*At the stop light  turn right onto [facility] [facility]. At the next light turn left onto [facility] [address], by the [employer]. *Continue straight to the [facility].*From [place]: Follow [address] towards [place]. Take exit [address], [employer]. *At the stop light, continue straight onto [address]. The [facility] will be right ahead of you.                  Alexis Bowers     This is a 33 y.o., female [id number] at 75w1dwho was admitted on 03/23/2019 for contractions. She delivered a viable female neonate (3400g) via SVD on 03/23/2019 @ 2140, APGARs 8/9.     Her postpartum course was uncomplicated, and she met postpartum milestones appropriately. Patient discharged to home today in stable condition with instructions on postpartum care and when to followup.  Pt understands and agrees with plan.     PE on discharge   BP 99/61   Pulse 68   Temp 36.7 C (98.1 F)   Resp 18   Ht 1.702 m (5' 7.01")   Wt 70.3 kg (155 lb)   LMP 06/15/2018   SpO2 100%   Breastfeeding Unknown   BMI 24.27 kg/m     GEN: AOx3, in no acute distress, appears stated age  44: NCAT, EOM grossly intact, conjunctivae clear, moist mucosa  LUNGS: CTAB, no wheezes, no rhonchi, no crackles  HEART: RRR, s1 and s2 present, no m/r/g  ABDOMEN: soft, non-tender, BSx4  EXTREMITIES: atraumatic, no edema  NEURO: CN 2-12 grossly normal, no focal deficits  SKIN: no rashes or lesions   PSYCH: Normal affect and mood    CONDITION ON DISCHARGE     A. Ambulation: Full Ambulation  B. Self-care Ability: Complete  C. Cognitive Status Alert and Oriented x 3     - Follow up at 4-6 weeks with Dr. Laurann Montana in [facility] clinic.     DISCHARGE DISPOSITION:  Home Discharge     cc: Primary Care Physician:  No Pcp  No address on file     VH:QIONGEXBM Physician:  No referring provider defined for this encounter.    Annell Greening, DO-MPH  03/25/2019, 07:59    [place] [address]  Dept: [phone]  Dept Fax: [phone]    PGY-1 Family Medicine.   [facility]. Dr. Laurann Montana    I have seen and evaluated the patient, I agree with above findings and plan    Deloris Ping, MD  03/25/2019, 10:47

## 2019-03-25 NOTE — Lactation Note (Signed)
This note was copied from a baby's chart.  Educated mom on signs of a good latch include ear and jaw movement, occasional swallowing and a latch that is not painful.   Written and verbal information given to mother which includes all of the following:  1. Signs baby is feeding adequately at the breast such as, breast changes and fullness by day 4, baby actively swallowing and mom feeling breast soften after feeding. Feeding cues and feeding on demand.  2. Baby returning to birth weight by at least 2 weeks of age/ normal weight gain   3. No excessive pain with nursing, nipples should not crack or bleed. Signs of good latch vs signs if an ineffective latch.  4. Adequate wet diapers with pale yellow urine (4-6 by one week of age)  5.Yellow seedy diapers appearing by one week of age with at least 3-4 bowel movements per day the size of a quarter  6. How to use a breast pump, hand expression and how to maintain lactation when separated from her baby. When to introduce a bottle and pacifier.  7 Maintaining milk supply, including feeding and or pumping 8-12 times per day  8. Managing problems such as engorgement, blocked milk ducts, and signs of symptoms of mastitis.  9. When to call for help, including signs of breast infection.  10. On line resources and information on current CDC recommendation on  Covid 19 and breastfeeding.  11. My contact information in case problems arise.                                                                Khadijah Mastrianni Ann Dona Klemann, RNC, CLC

## 2019-03-25 NOTE — Nurses Notes (Signed)
Please see note on baby's chart. Mom, Dad and baby have all slept soundly since 0100 feeding. Pt very appreciative of time and effort to work with infant to improve breast feeding. Encouraged pt to continue to work on improving latch. Verbalized understanding. Anticipating discharge home today. S Janye Maynor LPN II

## 2019-03-25 NOTE — Lactation Note (Signed)
Pt rang for me to come to room. Upon entering, pt is sitting on couch, holding baby and crying. Baby is screaming and mother states she can't figure out what baby wants or what will help her stop crying. I took baby, placed her in the crib and started doing all of her tests and weight. Baby cried inconsolably for about 45 minutes. I showed dad how to swaddle her, place her on her belly and pat her back, reminding dad to never leave her unattended while on her tummy. Verbalized understanding. Mother had refused any help or suggestions with breast feeding and was very short with me earlier in the shift. I began encouraging mom and helped her get baby latched on properly using football hold. Mom stated latch was no longer painful, baby noted to be gulping breast milk with audible swallows. Mother much more relaxed and less tearful. Will continue to assist as needed. Lolita Rieger LPN II/ CLC

## 2019-03-25 NOTE — Care Plan (Signed)
Pt d/c to home w/family. D/c info reviewed. Pt verbalized understanding.   Neta Mends, RN

## 2019-03-25 NOTE — Care Plan (Signed)
Problem: Adjustment to Role Transition (Postpartum Vaginal Delivery)  Goal: Successful Maternal Role Transition  Outcome: Ongoing (see interventions/notes)     Problem: Bleeding (Postpartum Vaginal Delivery)  Goal: Hemostasis  Outcome: Outcome Achieved     Problem: Infection (Postpartum Vaginal Delivery)  Goal: Absence of Infection Signs and Symptoms  Outcome: Ongoing (see interventions/notes)     Problem: Pain (Postpartum Vaginal Delivery)  Goal: Acceptable Pain Control  Outcome: Ongoing (see interventions/notes)     Problem: Urinary Retention (Postpartum Vaginal Delivery)  Goal: Effective Urinary Elimination  Outcome: Outcome Achieved     Problem: Breastfeeding  Goal: Effective Breastfeeding  Outcome: Ongoing (see interventions/notes)

## 2019-03-25 NOTE — Progress Notes (Signed)
[place] [id number]  Phone: [phone]       POST-PARTUM PROGRESS NOTE        PATIENT: Alexis Bowers  CHART NUMBER: [id number]  [place] OF SERVICE: 03/25/2019      SUBJECTIVE       Alexis Bowers is a 33 y.o. G1P1001 PPD #2 from an SVD of a single female neonate weighing 3400g at 40 weeks and 1 day gestation. She is breastfeeding. Patient was visited and examined this morning. She was comfortable overnight, reporting minimal pain and light bleeding. She is ambulating without issue, tolerating a regular diet without nausea or vomiting, and voiding appropriately. No BM since delivery.      OBJECTIVE     Filed Vitals:    03/24/19 2230 03/25/19 0517 03/25/19 0518 03/25/19 0700   BP:   99/61    Pulse:  73 68    Resp: 18  18 18    Temp:   36.7 C (98.1 F)    SpO2:  100% 100%      GENERAL: NAD, AOx3  ABD: Soft, NTTP, bowel sounds + in all four quadrants   FUNDUS: Firm  EXT: Minimal edema with no calf tenderness      ASSESSMENT and PLAN     This is a 33 y.o. G1P1001 PPD #2 from an SVD of a single female neonate weighing 3400g at 40 weeks and 1 day gestation.     1. Post-partum Care:   - Ambulating, urinating, tolerating POs.   - No BM since delivery; on Colace   - Pain well-controlled    Continue with postpartum care. Most likely discharge home in stable condition today with instructions for continuing postpartum care and plans for follow-up in clinic in 4-6 weeks.    Annell Greening, DO-MPH  03/25/2019, 07:23    I have seen and evaluated the patient, I agree with above findings and plan    Deloris Ping, MD  03/29/2019, 12:38

## 2019-03-29 ENCOUNTER — Encounter (HOSPITAL_COMMUNITY): Payer: Self-pay

## 2019-04-29 ENCOUNTER — Ambulatory Visit: Payer: Self-pay

## 2019-04-29 NOTE — Lactation Note (Signed)
This note was copied from a baby's chart.  Mom concerned about baby having a lip tie and not getting an effective latch. We discussed options. Visually the latch today looked effective with upper lip flanged appropriately. Baby is gaining weight, voiding and having yellow stools adequately. Mom given opportunity to ask questions.

## 2019-05-07 ENCOUNTER — Ambulatory Visit (INDEPENDENT_AMBULATORY_CARE_PROVIDER_SITE_OTHER): Payer: 59 | Admitting: Obstetrics & Gynecology

## 2019-05-07 ENCOUNTER — Other Ambulatory Visit: Payer: Self-pay

## 2019-05-07 ENCOUNTER — Encounter (FREE_STANDING_LABORATORY_FACILITY)
Admit: 2019-05-07 | Discharge: 2019-05-07 | Disposition: A | Discharge status: Home / Self Care | Payer: 59 | Attending: Obstetrics & Gynecology | Admitting: Obstetrics & Gynecology

## 2019-05-07 ENCOUNTER — Encounter (INDEPENDENT_AMBULATORY_CARE_PROVIDER_SITE_OTHER): Payer: Self-pay | Admitting: Obstetrics & Gynecology

## 2019-05-07 ENCOUNTER — Encounter (FREE_STANDING_LABORATORY_FACILITY): Payer: 59 | Admitting: Obstetrics & Gynecology

## 2019-05-07 ENCOUNTER — Encounter (INDEPENDENT_AMBULATORY_CARE_PROVIDER_SITE_OTHER): Payer: 59 | Admitting: Obstetrics & Gynecology

## 2019-05-07 DIAGNOSIS — Z124 Encounter for screening for malignant neoplasm of cervix: Secondary | ICD-10-CM

## 2019-05-07 DIAGNOSIS — Z1151 Encounter for screening for human papillomavirus (HPV): Secondary | ICD-10-CM

## 2019-05-07 NOTE — Progress Notes (Signed)
Alexis Bowers is a 34 y.o. female here for postpartum visit.  Delivered vaginally [facility]), no complications.  Overall doing well.  Denies signs/symptoms of postpartum depression. Breast feeding.      Review of Symptoms:  Constitutional: negative  HEENT: negative  Cardiac: negative  Respiratory: negative  GI: negative  GU: negative  Neuro: negative  Psych: negative    Physical Exam:  BP 102/60   Ht 1.702 m (5\' 7" )   Wt 59 kg (130 lb)   LMP 06/15/2018   BMI 20.36 kg/m       General: alert, oriented, no acute distress  HEENT: thyroid normal size  Breasts: deferred  Abdomen: soft, non-tender, non-distended  Pelvic: vulva without lesions, perineum well healed, mildly tender to palpation; vagina and cervix without lesions, no abnormal discharge, uterus small, non-tender, no adnexal tenderness  Psych: appropriate mood and affect    Assessment:   Postpartum state, doing well    Plan:  Pap collected  Plans vasectomy for contraception  Okay to resume all normal activities as tolerated  Follow-up for annual exam or prn.

## 2019-05-07 NOTE — Progress Notes (Signed)
POST PARTUM VISIT    34 y.o.    Gravida:1   Para     Living 1   Children Ellie  Infant:Female   Weight: 7lbs 8 oz   Apgars: 8    9  Delivery:  Date 03/23/2019  Spontaneous  Pregnancy complicated by  Infant Feeding: breast  Birth control:   Menses: N/a  Discharge date 03/25/2019

## 2019-05-19 LAB — CYTOPATHOLOGY, GYN +/- HIGH RISK HPV

## 2019-05-22 LAB — HUMAN PAPILLOMA VIRUS (HPV) BY PCR WITH HIGH RISK GENOTYPING (THINPREP)
HPV OTHER: NEGATIVE
HPV16 PCR: NEGATIVE
HPV18 PCR: NEGATIVE

## 2019-07-24 ENCOUNTER — Encounter (INDEPENDENT_AMBULATORY_CARE_PROVIDER_SITE_OTHER): Payer: Self-pay | Admitting: Obstetrics & Gynecology

## 2019-07-25 ENCOUNTER — Encounter (INDEPENDENT_AMBULATORY_CARE_PROVIDER_SITE_OTHER): Payer: Self-pay | Admitting: Advanced Practice Midwife

## 2019-07-25 DIAGNOSIS — Z1332 Encounter for screening for maternal depression: Secondary | ICD-10-CM | POA: Insufficient documentation

## 2019-07-29 ENCOUNTER — Encounter (INDEPENDENT_AMBULATORY_CARE_PROVIDER_SITE_OTHER): Payer: Self-pay | Admitting: Advanced Practice Midwife

## 2021-06-07 ENCOUNTER — Other Ambulatory Visit (HOSPITAL_COMMUNITY): Payer: Self-pay

## 2021-06-07 DIAGNOSIS — Z1231 Encounter for screening mammogram for malignant neoplasm of breast: Secondary | ICD-10-CM

## 2021-06-07 HISTORY — PX: HX BREAST AUGMENTATION: SHX7

## 2021-06-08 ENCOUNTER — Encounter (HOSPITAL_COMMUNITY): Payer: Self-pay

## 2021-06-08 ENCOUNTER — Inpatient Hospital Stay
Admission: RE | Admit: 2021-06-08 | Discharge: 2021-06-08 | Disposition: A | Discharge status: Home / Self Care | Payer: 59 | Source: Ambulatory Visit

## 2021-06-08 ENCOUNTER — Other Ambulatory Visit: Payer: Self-pay

## 2021-06-08 DIAGNOSIS — Z1231 Encounter for screening mammogram for malignant neoplasm of breast: Secondary | ICD-10-CM | POA: Insufficient documentation

## 2021-06-09 ENCOUNTER — Ambulatory Visit (HOSPITAL_COMMUNITY): Payer: Self-pay

## 2021-09-28 ENCOUNTER — Other Ambulatory Visit: Payer: Self-pay

## 2021-09-28 ENCOUNTER — Ambulatory Visit (INDEPENDENT_AMBULATORY_CARE_PROVIDER_SITE_OTHER): Payer: 59 | Admitting: Specialist

## 2021-09-28 ENCOUNTER — Other Ambulatory Visit: Payer: 59 | Attending: Specialist

## 2021-09-28 ENCOUNTER — Encounter (INDEPENDENT_AMBULATORY_CARE_PROVIDER_SITE_OTHER): Payer: Self-pay | Admitting: Specialist

## 2021-09-28 ENCOUNTER — Other Ambulatory Visit: Payer: 59 | Attending: Specialist | Admitting: Specialist

## 2021-09-28 VITALS — BP 104/72 | HR 70 | Ht 67.0 in | Wt 118.0 lb

## 2021-09-28 DIAGNOSIS — Z124 Encounter for screening for malignant neoplasm of cervix: Secondary | ICD-10-CM | POA: Insufficient documentation

## 2021-09-28 DIAGNOSIS — G43909 Migraine, unspecified, not intractable, without status migrainosus: Secondary | ICD-10-CM

## 2021-09-28 DIAGNOSIS — Z8659 Personal history of other mental and behavioral disorders: Secondary | ICD-10-CM

## 2021-09-28 DIAGNOSIS — Z01419 Encounter for gynecological examination (general) (routine) without abnormal findings: Secondary | ICD-10-CM

## 2021-09-28 NOTE — Progress Notes (Signed)
Alexis Bowers [facility]  OB/[facility]  [address]  [place] [address]  [phone]     Alexis Bowers is a 36 y.o. female who presents for an annual exam. The patient  has no complaints. Patient states that she is doing well.   She reports that she gets migraines and will lose her vision; takes 2 aleve to relieve the pain. Patient notes that she experienced this in her pregnancy too. Gets them once every couple months.     Patient states that she will have episodes before her cycle where she will wake up in the middle of the night and her stomach will hurt and has bowel movements and believes her blood pressure drops. Patient reports that her husband has to pick her up and take her back to lay down. This had happened more in the past prior to pregnancy.    Patient reports that last month she has been thinking about having more kids, but she states that she experienced postpartum depression for 2 years and anxiety. She started taking Zoloft in the last 6  months after stopping breast feeding. She feels this has helped a lot.     Patient notes that she got sick almost every day until her 3rd trimester. Patient states that her husband and herself don't want her to go through that again and she notes that she feels guilty to think that way. Patients husband has a vasectomy and is age 59.    Patient gets mammograms in [place]. Her last one was in March.    Patient states that she lifts weight and does yoga for exercise. She notes that she does not do any cardio because she would like to gain weight.     HPI/ROS:  No change in breast tissue, breast lumps, nipple discharge;, Normal menses, no abnormal bleeding, pelvic pain or discharge, No discharge or pelvic pain;, No problems with urination or bowel movements;    No Known Allergies  Outpatient Medications Marked as Taking for the 09/28/21 encounter (Office Visit) with Candice Camp, MD   Medication Sig    sertraline  (ZOLOFT) 25 mg Oral Tablet Take 1 Tablet (25 mg total) by mouth Once a day        Medical, Surgical and Family Hx Reviewed today and is contained within the electronic medical record.    G18P1001  FTSVD female 7#13 denies PIH or GDM  Menstrual History    Total time span for birth control pills 6 years     Use of HRT (how long total use) No     Use of Infertility Treatments No     Age of Menarche 63     Avg. frequency (days) 28     Duration (days) 4-5     Flow Light     Pattern Regular     Age of Menopause          Social History     Socioeconomic History    Marital status: Married    Years of education: 20   Tobacco Use    Smoking status: Never    Smokeless tobacco: Never   Vaping Use    Vaping Use: Never used   Substance and Sexual Activity    Alcohol use: Yes     Comment: socially    Drug use: Never    Sexual activity: Yes     Partners: Male     Social Determinants of Health     Financial  Resource Strain: Low Risk     SDOH Financial: No   Transportation Needs: Low Risk     SDOH Transportation: No   Social Connections: Low Risk     SDOH Social Isolation: 5 or more times a week   Intimate Partner Violence: Low Risk     SDOH Domestic Violence: No   Housing Stability: Low Risk     SDOH Housing Situation: I have housing.    SDOH Housing Worry: No       Physical Exam:     Weight: 53.5 kg (118 lb)  Height: 170.2 cm (5\' 7" )  BP (Non-Invasive): 104/72  Last Annual GYN Exam: 05/07/2019  Last Pap Date: 05/07/19  Last Pap Result: neg/neg  Last Mammo Date: 06/08/21  Last  Mammo Result: normal  LMP: 09/14/2021  Heart Rate: 70  Body mass index is 18.48 kg/m.    PE:    Physical Exam  Constitutional:       Appearance: Normal appearance. She is not ill-appearing.   Genitourinary:      Vulva, bladder, rectum and urethral meatus normal.      No lesions in the vagina.      Right Labia: No lesions or skin changes.     Left Labia: No lesions or skin changes.     No vaginal discharge.      No vaginal prolapse present.     No vaginal atrophy  present.       Right Adnexa: not tender and no mass present.     Left Adnexa: not tender and no mass present.     No cervical discharge or lesion.      Uterus is not tender.      Uterus is anteverted.      Bladder is not tender.       Pelvic exam was performed with patient in the lithotomy position.   Breasts:     Tanner Score is 5.      Breasts are symmetrical.      Right: No inverted nipple, mass, nipple discharge or skin change.      Left: No inverted nipple, mass, nipple discharge or skin change.   HENT:      Head: Normocephalic and atraumatic.      Mouth/Throat:      Mouth: Mucous membranes are moist.   Eyes:      Extraocular Movements: Extraocular movements intact.      Conjunctiva/sclera: Conjunctivae normal.   Cardiovascular:      Rate and Rhythm: Normal rate.   Pulmonary:      Effort: Pulmonary effort is normal.      Breath sounds: Normal breath sounds.   Abdominal:      General: There is no distension.      Palpations: Abdomen is soft. There is no mass.      Tenderness: There is no abdominal tenderness.   Musculoskeletal:         General: Normal range of motion.      Cervical back: Normal range of motion and neck supple.      Right lower leg: No edema.      Left lower leg: No edema.   Neurological:      General: No focal deficit present.      Mental Status: She is alert and oriented to person, place, and time.   Skin:     General: Skin is warm and moist.   Psychiatric:         Attention and  Perception: Attention normal.         Mood and Affect: Mood normal.         Speech: Speech normal.         Behavior: Behavior normal.   Exam conducted with a chaperone present.      Assessment:       ICD-10-CM    1. Encounter for well woman exam with routine gynecological exam  Z01.419       2. Screening for cervical cancer  Z12.4 CYTOPATHOLOGY, GYN +/- HIGH RISK HPV            Plan: Discussed    Postpartum depression and recovery   Diet and macros  Preconception concerns   Classic migraines   Discussed pap notification      No follow-ups on file.   Orders Placed This Encounter    CYTOPATHOLOGY, GYN +/- HIGH RISK HPV     I am scribing for, and in the presence of, Dr. Candice Camp for services provided on 09/28/2021.  Wendie Simmer, SCRIBE   Wendie Simmer, SCRIBE  09/28/2021, 11:31     I personally performed the services described in this documentation, as scribed  in my presence, and it is both accurate  and complete.    Candice Camp MD   [facility] [place]

## 2021-09-28 NOTE — Nursing Note (Signed)
Pt here for annual exam. Pt has no questions or concerns at this time. Pts medications, allergies, and history up to date.   PHQ Questionnaire  Little interest or pleasure in doing things.: Not at all  Feeling down, depressed, or hopeless: Not at all  PHQ 2 Total: 0        Weight: 53.5 kg (118 lb)  Height: 170.2 cm (5\' 7" )  BP (Non-Invasive): 104/72  Last Annual GYN Exam: 05/07/2019  Last Pap Date: 05/07/19  Last Pap Result: neg/neg  Last Mammo Date: 06/08/21  Last  Mammo Result: normal  LMP: 09/14/2021      11/14/2021, LPN  Ver[place] Chol, 11:16

## 2021-10-09 LAB — CYTOPATHOLOGY, GYN +/- HIGH RISK HPV

## 2021-10-18 LAB — HUMAN PAPILLOMA VIRUS (HPV) BY PCR WITH HIGH RISK GENOTYPING (THINPREP)
HPV OTHER: NEGATIVE
HPV16 PCR: NEGATIVE
HPV18 PCR: NEGATIVE

## 2022-05-27 ENCOUNTER — Encounter (INDEPENDENT_AMBULATORY_CARE_PROVIDER_SITE_OTHER): Payer: Self-pay

## 2022-10-04 ENCOUNTER — Ambulatory Visit (HOSPITAL_COMMUNITY): Payer: Self-pay | Admitting: Specialist

## 2022-12-06 ENCOUNTER — Other Ambulatory Visit: Payer: Self-pay

## 2022-12-06 ENCOUNTER — Inpatient Hospital Stay
Admission: RE | Admit: 2022-12-06 | Discharge: 2022-12-06 | Disposition: A | Discharge status: Home / Self Care | Payer: 59 | Source: Ambulatory Visit

## 2022-12-06 ENCOUNTER — Other Ambulatory Visit (HOSPITAL_COMMUNITY): Payer: Self-pay

## 2022-12-06 DIAGNOSIS — M545 Low back pain, unspecified: Secondary | ICD-10-CM | POA: Insufficient documentation

## 2022-12-18 IMAGING — MR MRI LUMBAR SPINE WITHOUT CONTRAST
5 of 6 series · 32 of 48 positions shown · IV contrast (gadolinium)
Comparison: None available.

﻿EXAM:  [id number]   MRI LUMBAR SPINE WITHOUT CONTRAST
INDICATION: 37-year-old female with persistent low back pain with radicular symptoms to right lower extremity with numbness and tingling. No history of back surgery.
TECHNIQUE: Multiplanar, multisequential MRI of the lumbosacral spine was performed without gadolinium contrast.

[Series 5: T2 · sagittal · 4.0mm · 0.94mm/px · 6 of 13 slices shown (1 of 3)]
[im 1/13]
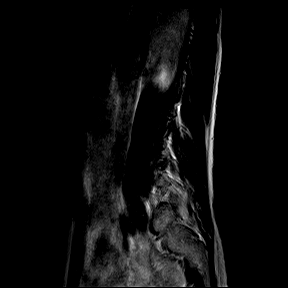
[im 3/13]
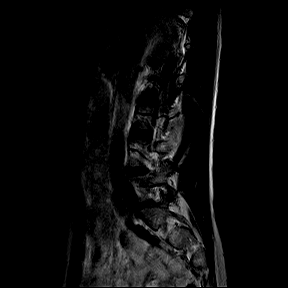
[im 5/13]
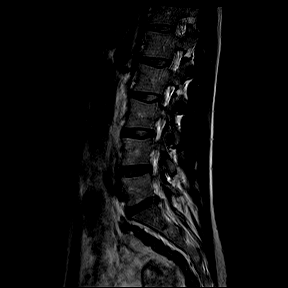
[im 8/13]
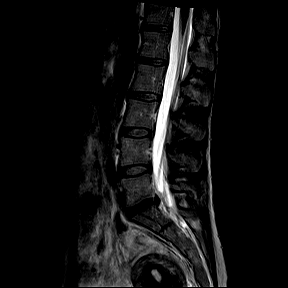
[im 10/13]
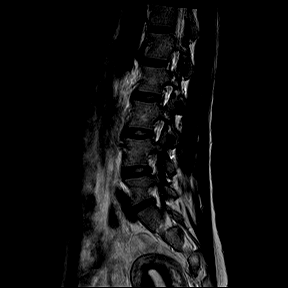
[im 13/13]
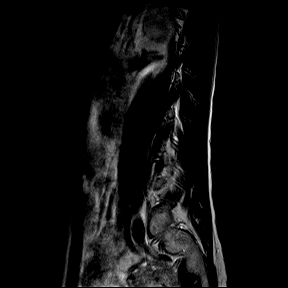

[Series 6: T1 · sagittal · 4.0mm · 0.94mm/px · 6 of 13 slices shown (1 of 2)]
[im 1/13]
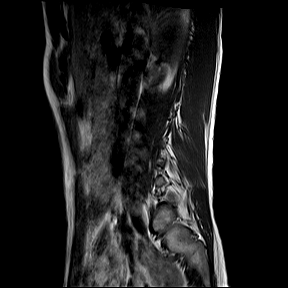
[im 3/13]
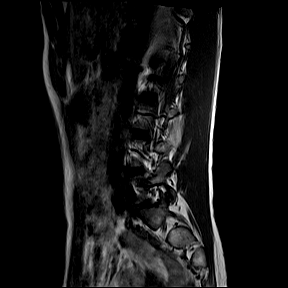
[im 5/13]
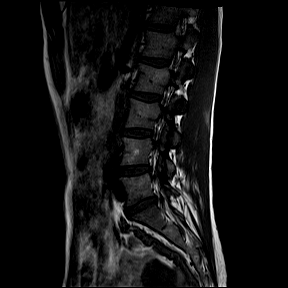
[im 8/13]
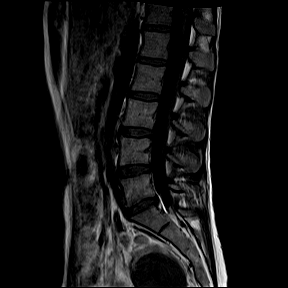
[im 10/13]
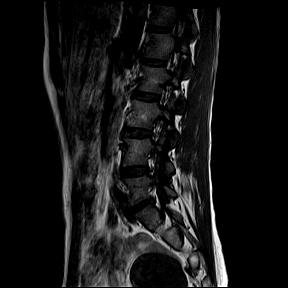
[im 13/13]
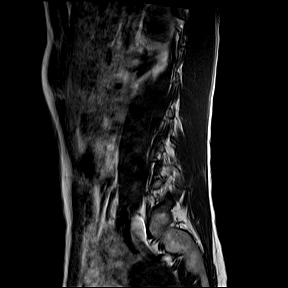

[Series 8: T2 · coronal · 5.0mm · 0.82mm/px · 8 of 18 slices shown (2 of 3)]
[im 1/18]
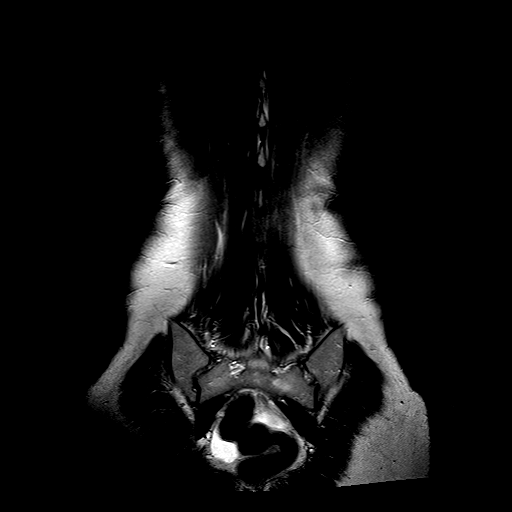
[im 3/18]
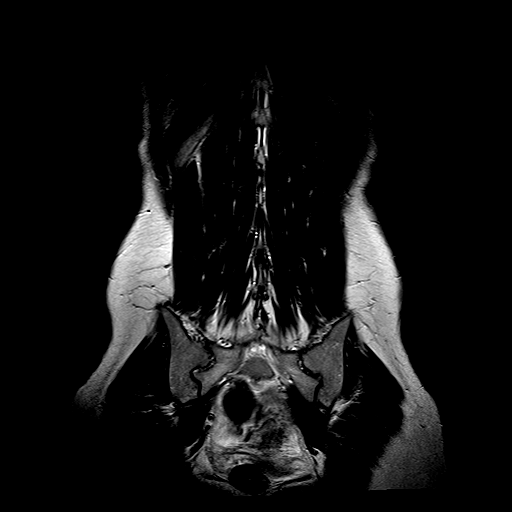
[im 5/18]
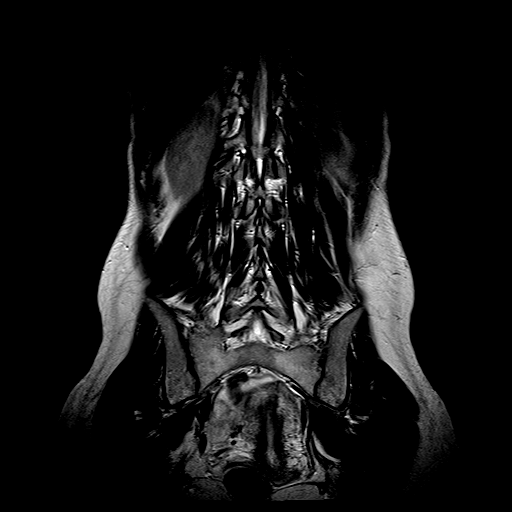
[im 8/18]
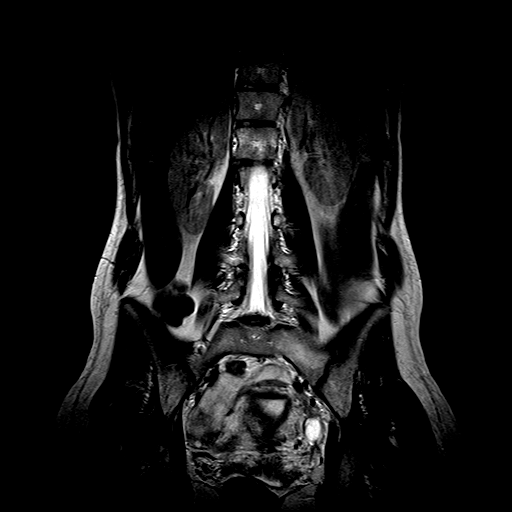
[im 10/18]
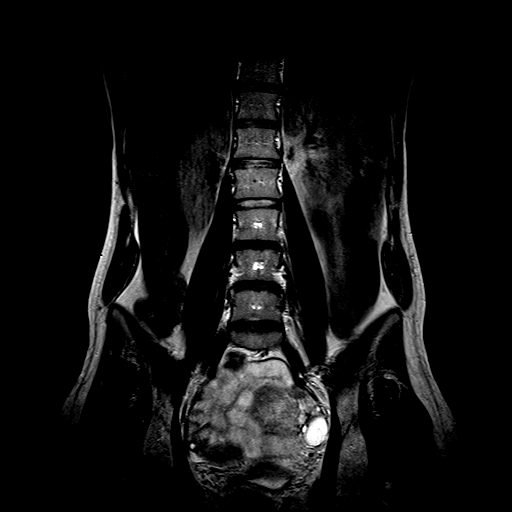
[im 13/18]
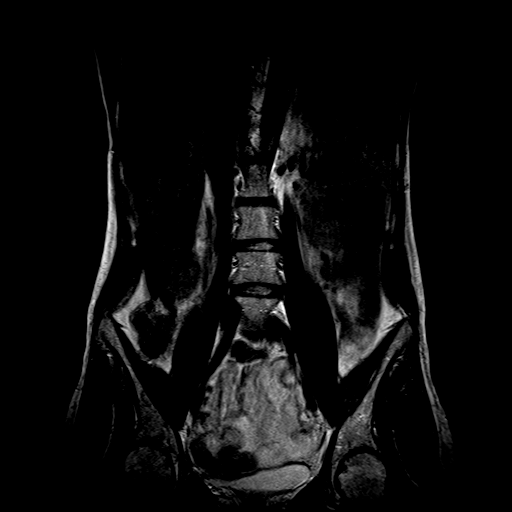
[im 15/18]
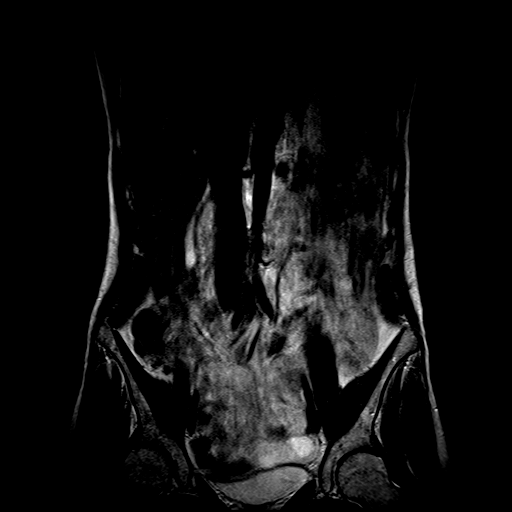
[im 18/18]
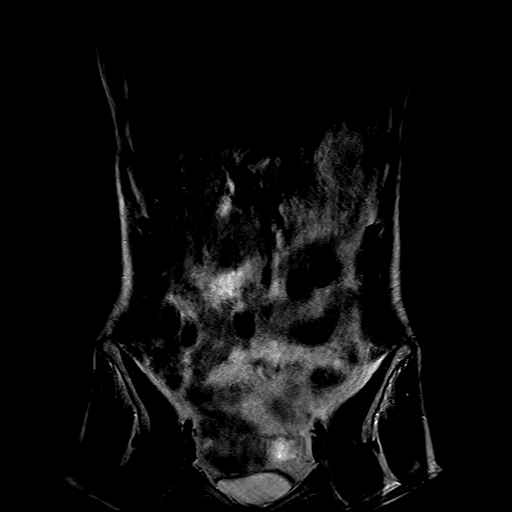

[Series 9: T2 · axial · 4.0mm · 0.52mm/px · z∈[-66,+130]mm · 11 of 23 slices shown (3 of 3)]
[im 1/23]
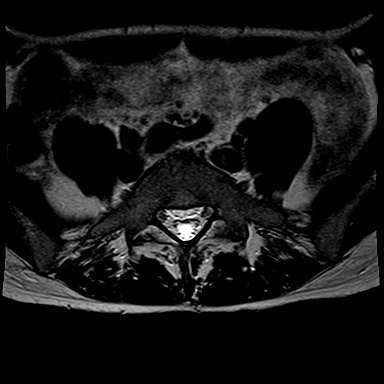
[im 3/23]
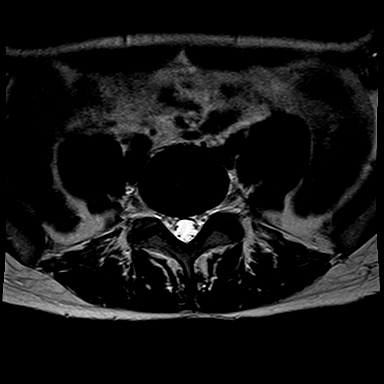
[im 5/23]
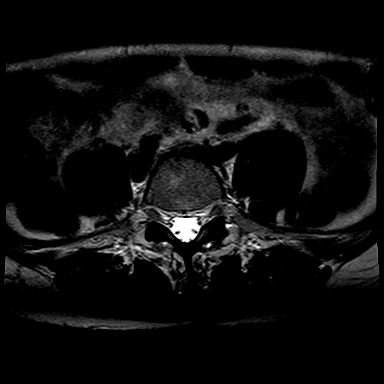
[im 7/23]
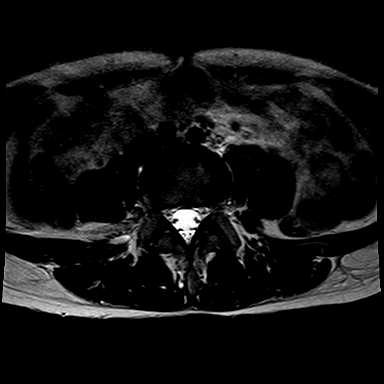
[im 9/23]
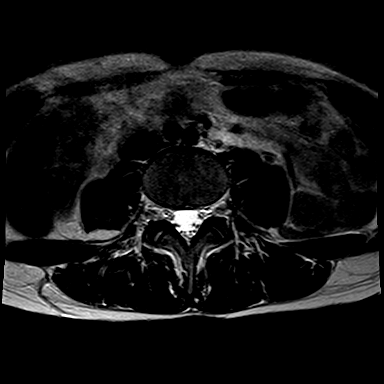
[im 12/23]
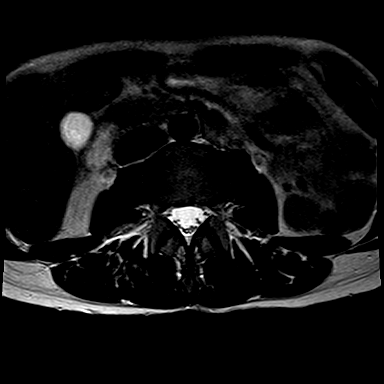
[im 14/23]
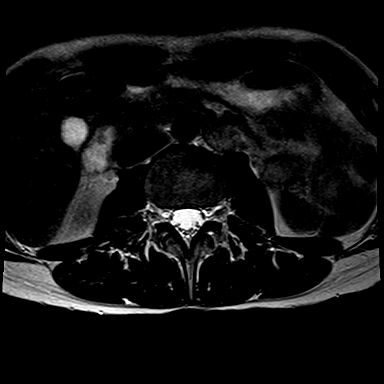
[im 16/23]
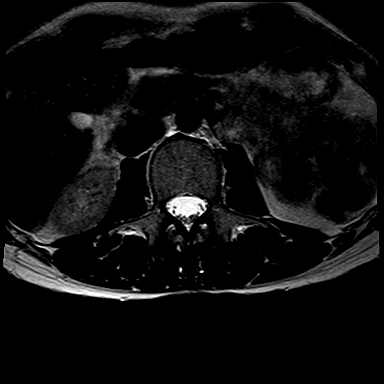
[im 18/23]
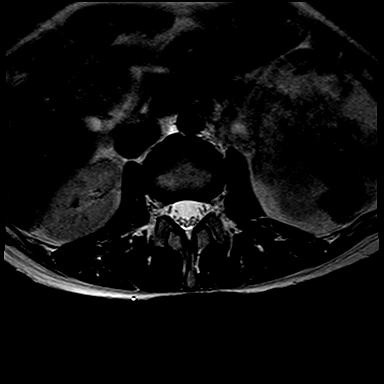
[im 20/23]
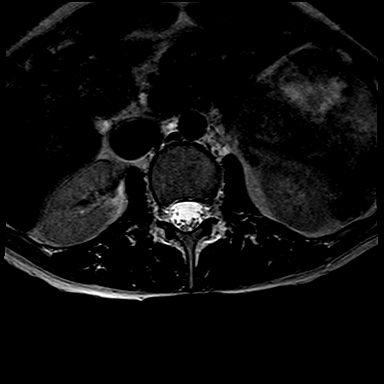
[im 23/23]
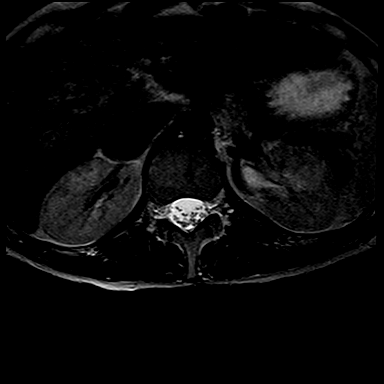

[Series 10: T1 · axial · 4.0mm · 0.52mm/px · 1 of 23 slices shown (2 of 2)]
[im 1/23]
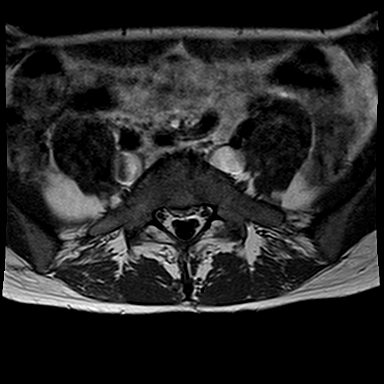

[32 of 48 positions shown; findings below may reference images not displayed]

FINDINGS: Mild Modic type 1 inflammatory changes on both sides of L5-S1 disc.

Cauda structures are normal. Conus terminates at T12-L1 level.

At L1-2 level, no focal disc lesions are seen. L2-3 and L3-4 discs are normal. 

At L4-5 level, mild facet arthropathy is noted causing minimal compromise of both lateral recess. 

At L5-S1 level, moderate loss of disc space with degenerative changes with mild Modic inflammatory changes adjacent L5-S1 disc.  Bulging annulus with facet arthropathy is causing mild compromise of both lateral recess and mildly impinging on S1 nerve roots on both sides.

Paravertebral soft tissues are normal. Benign 2 cm follicle in the left ovary is noted.
IMPRESSION: 1. At L5-S1 level, moderate loss of disc space with degenerative changes with mild Modic inflammatory changes adjacent L5-S1 disc.  Bulging annulus with facet arthropathy is causing mild compromise of both lateral recess and mildly impinging on S1 nerve roots on both sides.

2. Remaining discs are normal.  Paravertebral soft tissues are unremarkable.

## 2023-04-17 ENCOUNTER — Encounter (HOSPITAL_BASED_OUTPATIENT_CLINIC_OR_DEPARTMENT_OTHER): Payer: Self-pay | Admitting: Physical Medicine & Rehabilitation

## 2023-04-17 ENCOUNTER — Other Ambulatory Visit: Payer: Self-pay

## 2023-04-17 ENCOUNTER — Ambulatory Visit: Payer: 59 | Attending: Physical Medicine & Rehabilitation | Admitting: Physical Medicine & Rehabilitation

## 2023-04-17 VITALS — BP 100/63 | HR 65 | Temp 97.7°F | Resp 16 | Ht 67.0 in | Wt 124.1 lb

## 2023-04-17 DIAGNOSIS — M51369 Other intervertebral disc degeneration, lumbar region without mention of lumbar back pain or lower extremity pain: Secondary | ICD-10-CM | POA: Insufficient documentation

## 2023-04-17 DIAGNOSIS — M47816 Spondylosis without myelopathy or radiculopathy, lumbar region: Secondary | ICD-10-CM | POA: Insufficient documentation

## 2023-04-17 MED ORDER — DICLOFENAC SODIUM 75 MG TABLET,DELAYED RELEASE
75.0000 mg | DELAYED_RELEASE_TABLET | Freq: Two times a day (BID) | ORAL | 1 refills | Status: AC
Start: 2023-04-17 — End: ?
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
## 2023-04-18 ENCOUNTER — Encounter (HOSPITAL_COMMUNITY): Payer: Self-pay | Admitting: Specialist

## 2023-04-18 ENCOUNTER — Ambulatory Visit: Payer: 59 | Attending: Specialist | Admitting: Specialist

## 2023-04-18 VITALS — BP 96/54 | HR 75 | Ht 67.0 in | Wt 121.0 lb

## 2023-04-18 DIAGNOSIS — Z01419 Encounter for gynecological examination (general) (routine) without abnormal findings: Secondary | ICD-10-CM | POA: Insufficient documentation

## 2023-04-18 NOTE — Progress Notes (Signed)
[facility]  OB/GYN, [facility]  [address]  [place] [address]  [id number]     Alexis Bowers is a G1P1001 38 y.o. female who presents for a(n) ESTABLISHED patient Well Woman Exam.       Assessment:       ICD-10-CM    1. Encounter for well woman exam with routine gynecological exam  Z01.419             Plan:   Discussed health maintenance.   Discussed pregnancy    Return in about 1 year (around 04/17/2024) for RGN, WWE, BRX.   No orders of the defined types were placed in this encounter.      History of Present Illness    Patient presents today for an annual exam. Patient states she is doing well. The patient  has no complaints. She reports she has experienced vaginal itching prior and following menses. Especially in relation to any recent UTI's/abx. Has not occurred recently.     Patient is considering children in the near future. She continues Zoloft and continues to do well on it. Has been experiencing night sweats. She believes her mother started menopause at a young age.     Patient continues yoga, goal weight to be 125 lbs.   Has been experiencing lower back pain. [facility].    HPI/ROS:  Has no change in mood;, No change in breast tissue, breast lumps, nipple discharge;, Normal menses, no abnormal bleeding, pelvic pain or discharge, No discharge or pelvic pain;, No problems with urination or bowel movements;    Medical, Surgical and Family Hx Reviewed today and is contained within the electronic medical record.    G1P1001      There is no immunization history on file for this patient.  Gyn History       LMP: 03/26/2023, Having periods    Age at Menarche: 57    Age at First Pregnancy: 82    Age at Menopause:     Gyn History Comments:     Sexual Activity: Yes; Female    Contraception: Female Sterilization           Social History     Socioeconomic History    Marital status: Married    Years of education: 20   Tobacco  Use    Smoking status: Never    Smokeless tobacco: Never   Vaping Use    Vaping status: Never Used   Substance and Sexual Activity    Alcohol use: Yes     Comment: socially    Drug use: Never    Sexual activity: Yes     Partners: Male     Birth control/protection: Female Sterilization     Social Determinants of Health     Financial Resource Strain: Low Risk  (04/18/2023)    Financial Resource Strain     SDOH Financial: No   Transportation Needs: Low Risk  (04/18/2023)    Transportation Needs     SDOH Transportation: No   Social Connections: Low Risk  (09/28/2021)    Social Connections     SDOH Social Isolation: 5 or more times a week   Intimate Partner Violence: Low Risk  (04/18/2023)    Intimate Partner Violence     SDOH Domestic Violence: No   Housing Stability: Low Risk  (04/18/2023)    Housing Stability     SDOH Housing Situation: I have housing.  SDOH Housing Worry: No           Physical Exam    Weight: 54.9 kg (121 lb)  Height: 170.2 cm (5\' 7" )  BP (Non-Invasive): (!) 96/54  Last Annual GYN Exam: 09/28/2021  Last Pap Date: 09/28/21  Last Pap Result: neg/neg  Last Mammo Date: 06/08/21  Last  Mammo Result: neg  LMP: 03/26/2023  Heart Rate: 75  Body mass index is 18.95 kg/m.    PE:  Physical Exam  Constitutional:       Appearance: Normal appearance. She is not ill-appearing.   Genitourinary:      Vulva, bladder, rectum and urethral meatus normal.      No lesions in the vagina.      Right Labia: No lesions or skin changes.     Left Labia: No lesions or skin changes.     No vaginal discharge.      No vaginal prolapse present.     No vaginal atrophy present.       Right Adnexa: not tender and no mass present.     Left Adnexa: not tender and no mass present.     No cervical discharge or lesion.      Uterus is not tender.      Uterus is retroverted.      Bladder is not tender.       Pelvic exam was performed with patient in the lithotomy position.   Breasts:     Tanner Score is 5.      Breasts are symmetrical.      Right: No  inverted nipple, mass, nipple discharge or skin change.      Left: No inverted nipple, mass, nipple discharge or skin change.   HENT:      Head: Normocephalic and atraumatic.      Mouth/Throat:      Mouth: Mucous membranes are moist.   Eyes:      Extraocular Movements: Extraocular movements intact.      Conjunctiva/sclera: Conjunctivae normal.   Cardiovascular:      Rate and Rhythm: Normal rate.   Pulmonary:      Effort: Pulmonary effort is normal.      Breath sounds: Normal breath sounds.   Abdominal:      General: There is no distension.      Palpations: Abdomen is soft. There is no mass.      Tenderness: There is no abdominal tenderness.   Musculoskeletal:         General: Normal range of motion.      Cervical back: Normal range of motion and neck supple.      Right lower leg: No edema.      Left lower leg: No edema.   Neurological:      General: No focal deficit present.      Mental Status: She is alert and oriented to person, place, and time.   Skin:     General: Skin is warm and moist.   Psychiatric:         Attention and Perception: Attention normal.         Mood and Affect: Mood normal.         Speech: Speech normal.         Behavior: Behavior normal.   Exam conducted with a chaperone present.             I am scribing for, and in the presence of, Dr. Candice Camp for services provided on 04/18/2023.  Wendie Simmer, SCRIBE   Wendie Simmer, SCRIBE  04/18/2023, 13:43    I personally performed the services described in this documentation, as scribed  in my presence, and it is both accurate  and complete.    Candice Camp MD   [facility] Medicine [facility] [place]

## 2023-04-18 NOTE — Nursing Note (Signed)
Pt here for annual exam. Pt has no questions or concerns at this time. Pts medications, allergies, and history up to date.   PHQ Questionnaire  Little interest or pleasure in doing things.: Not at all  Feeling down, depressed, or hopeless: Not at all  PHQ 2 Total: 0        Weight: 54.9 kg (121 lb)  Height: 170.2 cm (5\' 7" )  BP (Non-Invasive): (!) 96/54  Last Annual GYN Exam: 09/28/2021  Last Pap Date: 09/28/21  Last Pap Result: neg/neg  Last Mammo Date: 06/08/21  Last  Mammo Result: neg  LMP: 03/26/2023      Edsel Petrin, LPN  12/14/2950, 84:13

## 2023-04-19 ENCOUNTER — Other Ambulatory Visit (HOSPITAL_BASED_OUTPATIENT_CLINIC_OR_DEPARTMENT_OTHER): Payer: Self-pay | Admitting: Physical Medicine & Rehabilitation

## 2023-04-19 DIAGNOSIS — M545 Low back pain, unspecified: Secondary | ICD-10-CM

## 2023-04-19 DIAGNOSIS — M51369 Other intervertebral disc degeneration, lumbar region without mention of lumbar back pain or lower extremity pain: Secondary | ICD-10-CM

## 2023-04-19 DIAGNOSIS — R102 Pelvic and perineal pain: Secondary | ICD-10-CM

## 2023-05-31 ENCOUNTER — Ambulatory Visit (HOSPITAL_COMMUNITY)
Admission: RE | Admit: 2023-05-31 | Discharge: 2023-05-31 | Disposition: A | Discharge status: Home / Self Care | Payer: 59 | Source: Ambulatory Visit | Attending: Physical Medicine & Rehabilitation | Admitting: Radiology

## 2023-05-31 ENCOUNTER — Other Ambulatory Visit: Payer: Self-pay

## 2023-05-31 ENCOUNTER — Ambulatory Visit
Admission: RE | Admit: 2023-05-31 | Discharge: 2023-05-31 | Disposition: A | Discharge status: Home / Self Care | Payer: 59 | Source: Ambulatory Visit | Attending: Physical Medicine & Rehabilitation | Admitting: Radiology

## 2023-05-31 DIAGNOSIS — M51369 Other intervertebral disc degeneration, lumbar region without mention of lumbar back pain or lower extremity pain: Secondary | ICD-10-CM

## 2023-05-31 DIAGNOSIS — M545 Low back pain, unspecified: Secondary | ICD-10-CM

## 2023-05-31 DIAGNOSIS — R102 Pelvic and perineal pain: Secondary | ICD-10-CM | POA: Insufficient documentation

## 2023-05-31 MED ORDER — GADOPICLENOL 0.5 MMOL/ML INTRAVENOUS SOLUTION
5.0000 mL | INTRAVENOUS | Status: AC
Start: 2023-05-31 — End: 2023-05-31
  Administered 2023-05-31: 5 mL via INTRAVENOUS

## 2023-06-03 ENCOUNTER — Encounter (HOSPITAL_BASED_OUTPATIENT_CLINIC_OR_DEPARTMENT_OTHER): Payer: Self-pay | Admitting: Physician Assistant

## 2023-06-03 ENCOUNTER — Other Ambulatory Visit: Payer: Self-pay

## 2023-06-03 ENCOUNTER — Ambulatory Visit: Payer: 59 | Attending: Physician Assistant | Admitting: Physician Assistant

## 2023-06-03 VITALS — BP 103/63 | HR 77 | Temp 98.6°F | Resp 16 | Ht 67.0 in | Wt 122.4 lb

## 2023-06-03 DIAGNOSIS — M5127 Other intervertebral disc displacement, lumbosacral region: Secondary | ICD-10-CM

## 2023-06-03 DIAGNOSIS — M51369 Other intervertebral disc degeneration, lumbar region without mention of lumbar back pain or lower extremity pain: Secondary | ICD-10-CM | POA: Insufficient documentation
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
## 2023-06-04 ENCOUNTER — Encounter (HOSPITAL_COMMUNITY): Payer: Self-pay | Admitting: Specialist

## 2023-06-04 DIAGNOSIS — R935 Abnormal findings on diagnostic imaging of other abdominal regions, including retroperitoneum: Secondary | ICD-10-CM

## 2023-07-09 ENCOUNTER — Other Ambulatory Visit: Payer: Self-pay

## 2023-07-09 ENCOUNTER — Ambulatory Visit (INDEPENDENT_AMBULATORY_CARE_PROVIDER_SITE_OTHER): Payer: Self-pay
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
## 2023-07-10 ENCOUNTER — Inpatient Hospital Stay
Admission: RE | Admit: 2023-07-10 | Discharge: 2023-07-10 | Disposition: A | Discharge status: Home / Self Care | Payer: Self-pay | Source: Ambulatory Visit | Attending: Specialist | Admitting: Specialist

## 2023-07-10 DIAGNOSIS — R935 Abnormal findings on diagnostic imaging of other abdominal regions, including retroperitoneum: Secondary | ICD-10-CM | POA: Insufficient documentation

## 2023-07-11 ENCOUNTER — Ambulatory Visit (HOSPITAL_COMMUNITY): Payer: Self-pay | Admitting: Specialist

## 2023-07-15 ENCOUNTER — Ambulatory Visit (HOSPITAL_BASED_OUTPATIENT_CLINIC_OR_DEPARTMENT_OTHER): Payer: Self-pay

## 2023-07-30 ENCOUNTER — Other Ambulatory Visit (HOSPITAL_BASED_OUTPATIENT_CLINIC_OR_DEPARTMENT_OTHER): Payer: Self-pay | Admitting: Physical Medicine & Rehabilitation

## 2023-07-30 DIAGNOSIS — M51369 Other intervertebral disc degeneration, lumbar region without mention of lumbar back pain or lower extremity pain: Secondary | ICD-10-CM

## 2023-07-30 DIAGNOSIS — M47816 Spondylosis without myelopathy or radiculopathy, lumbar region: Secondary | ICD-10-CM

## 2023-08-06 ENCOUNTER — Other Ambulatory Visit (HOSPITAL_BASED_OUTPATIENT_CLINIC_OR_DEPARTMENT_OTHER): Payer: Self-pay | Admitting: Physical Medicine & Rehabilitation

## 2023-08-06 DIAGNOSIS — M47816 Spondylosis without myelopathy or radiculopathy, lumbar region: Secondary | ICD-10-CM

## 2023-08-06 DIAGNOSIS — M51369 Other intervertebral disc degeneration, lumbar region without mention of lumbar back pain or lower extremity pain: Secondary | ICD-10-CM

## 2023-08-07 ENCOUNTER — Ambulatory Visit
Payer: Self-pay | Attending: Student in an Organized Health Care Education/Training Program | Admitting: Student in an Organized Health Care Education/Training Program

## 2023-08-07 ENCOUNTER — Other Ambulatory Visit: Payer: Self-pay

## 2023-08-07 VITALS — BP 122/68 | HR 70 | Temp 97.1°F | Resp 16 | Wt 123.9 lb

## 2023-08-07 DIAGNOSIS — M5126 Other intervertebral disc displacement, lumbar region: Secondary | ICD-10-CM | POA: Insufficient documentation

## 2023-08-07 DIAGNOSIS — M7918 Myalgia, other site: Secondary | ICD-10-CM | POA: Insufficient documentation

## 2023-08-07 DIAGNOSIS — M5116 Intervertebral disc disorders with radiculopathy, lumbar region: Secondary | ICD-10-CM

## 2023-08-07 DIAGNOSIS — M545 Low back pain, unspecified: Secondary | ICD-10-CM | POA: Insufficient documentation

## 2023-08-07 DIAGNOSIS — M5416 Radiculopathy, lumbar region: Secondary | ICD-10-CM | POA: Insufficient documentation

## 2023-08-07 DIAGNOSIS — M51369 Other intervertebral disc degeneration, lumbar region without mention of lumbar back pain or lower extremity pain: Secondary | ICD-10-CM | POA: Insufficient documentation

## 2023-08-07 DIAGNOSIS — M4726 Other spondylosis with radiculopathy, lumbar region: Secondary | ICD-10-CM

## 2023-08-07 DIAGNOSIS — M47816 Spondylosis without myelopathy or radiculopathy, lumbar region: Secondary | ICD-10-CM | POA: Insufficient documentation
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
## 2023-08-07 NOTE — Nursing Note (Signed)
 Pain and Function:     [place] Pain Rating Scale     On a scale of 0-10, during the past 24 hours, pain has interfered with you usual activity:4       On a scale of 0-10, during the past 24 hours, pain has interfered with your sleep:  2    On a scale of 0-10, during the past 24 hours, pain has affected your mood: 1      On a scale of 0-10, during the past 24 hours, pain has contributed to your stress: 0      On a scale of 0-10, what is your overall pain Rating: 6

## 2023-08-12 ENCOUNTER — Ambulatory Visit: Attending: Physical Medicine & Rehabilitation | Admitting: Physical Medicine & Rehabilitation

## 2023-08-12 ENCOUNTER — Other Ambulatory Visit: Payer: Self-pay

## 2023-08-12 ENCOUNTER — Encounter (HOSPITAL_BASED_OUTPATIENT_CLINIC_OR_DEPARTMENT_OTHER): Payer: Self-pay | Admitting: Physical Medicine & Rehabilitation

## 2023-08-12 VITALS — BP 104/68 | HR 72 | Temp 97.6°F | Resp 16 | Wt 126.0 lb

## 2023-08-12 DIAGNOSIS — M5136 Other intervertebral disc degeneration, lumbar region with discogenic back pain only: Secondary | ICD-10-CM

## 2023-08-12 DIAGNOSIS — M51369 Other intervertebral disc degeneration, lumbar region without mention of lumbar back pain or lower extremity pain: Secondary | ICD-10-CM | POA: Insufficient documentation

## 2023-08-12 DIAGNOSIS — M47816 Spondylosis without myelopathy or radiculopathy, lumbar region: Secondary | ICD-10-CM | POA: Insufficient documentation

## 2023-08-13 NOTE — Progress Notes (Signed)
 EPS  PATIENT NAME: Alexis, Bowers   HOSPITAL NUMBER:  [id number]  DATE OF SERVICE: 08/12/2023  DATE OF BIRTH:  [birth year]    PROGRESS NOTE    SUBJECTIVE:  Alexis Bowers, a very nice woman, 38 years old.  She had pain across the lumbosacral spine associated with degenerative disk disease, bulging disk as well as some facet arthropathy.  The patient has more pain now with flexion and extension.  Both directions gave her pain.  Initially, it was thought that maybe extension was the most uncomfortable for her and maybe will consider facet blocks.  Dr. Zelda Hickman did evaluate the patient and because of pain and discomfort, recommendation would be EPS blocks along with an epidural.  The patient was fully assessed, evaluated and examined jointly with Dr. Zelda Hickman.    OBJECTIVE:  On examination, her blood pressure is 104/68, pulse 79, temperature 97.6 degrees Fahrenheit, respirations 16, O2 sats 98%.  Weight 57.1 kg.  Alert, oriented, follows commands completely well.  Flattening of lumbar lordosis noted.  Lateral bending is uncomfortable.  LS  extension was uncomfortable.  Forward flexion was also uncomfortable at 30-40 degrees.  Valsalva maneuvers were positive for back pain mostly.  Muscle strength is normal.  Reflexes normal.  Sensation is normal.  No edema.  No spasticity.  No tremors.  No atrophy.  No fasciculations.  No abnormal movements or reflex.    REVIEW OF SYSTEMS:  Negative full review of systems.    ASSESSMENT AND PLAN:  After fully discussed with the patient, we feel the patient would benefit from recommendation of an epidural injection as well as EPS injections. Thus,  the patient will undergo these procedures, Dr. Zelda Hickman agrees, and the patient will be reassessed and evaluated after these interventions.        Raleigh Bureau, MD  Medical Director, [facility]  [place] [facility]              DD:  08/12/2023 11:50:53  DT:  08/13/2023 06:04:00 TAW  D#:  [id number]

## 2023-08-15 ENCOUNTER — Encounter (HOSPITAL_BASED_OUTPATIENT_CLINIC_OR_DEPARTMENT_OTHER): Payer: Self-pay | Admitting: Student in an Organized Health Care Education/Training Program

## 2023-08-16 ENCOUNTER — Telehealth (HOSPITAL_BASED_OUTPATIENT_CLINIC_OR_DEPARTMENT_OTHER): Payer: Self-pay | Admitting: Student in an Organized Health Care Education/Training Program

## 2023-08-16 NOTE — Telephone Encounter (Signed)
 Called patient to schedule LESI / ESP. Scheduled for 09-03-23 at 2:15 arrive at 1. Follow up 09-25-23 at 10:15.  Went over instructions and mailed instructions and appt reminders to patient. Informed of med holds. Med holds : DICLOFENAC  3 DAYS    Talissa K Wolfe

## 2023-08-30 ENCOUNTER — Other Ambulatory Visit: Payer: Self-pay

## 2023-08-30 ENCOUNTER — Encounter (HOSPITAL_COMMUNITY)
Admission: RE | Disposition: A | Discharge status: Home / Self Care | Payer: Self-pay | Source: Ambulatory Visit | Attending: Student in an Organized Health Care Education/Training Program

## 2023-08-30 ENCOUNTER — Ambulatory Visit
Admission: RE | Admit: 2023-08-30 | Discharge: 2023-08-30 | Disposition: A | Discharge status: Home / Self Care | Source: Ambulatory Visit | Attending: Student in an Organized Health Care Education/Training Program | Admitting: Student in an Organized Health Care Education/Training Program

## 2023-08-30 ENCOUNTER — Ambulatory Visit (HOSPITAL_COMMUNITY)
Admission: RE | Admit: 2023-08-30 | Discharge: 2023-08-30 | Disposition: A | Discharge status: Home / Self Care | Source: Ambulatory Visit

## 2023-08-30 ENCOUNTER — Encounter (HOSPITAL_COMMUNITY): Payer: Self-pay | Admitting: Student in an Organized Health Care Education/Training Program

## 2023-08-30 ENCOUNTER — Ambulatory Visit (HOSPITAL_COMMUNITY): Admitting: Student in an Organized Health Care Education/Training Program

## 2023-08-30 DIAGNOSIS — M4726 Other spondylosis with radiculopathy, lumbar region: Secondary | ICD-10-CM | POA: Insufficient documentation

## 2023-08-30 DIAGNOSIS — M545 Low back pain, unspecified: Secondary | ICD-10-CM | POA: Insufficient documentation

## 2023-08-30 LAB — HCG, URINE QUALITATIVE, PREGNANCY: HCG URINE QUALITATIVE: NEGATIVE

## 2023-08-30 SURGERY — ~~LOC~~ OR MINOR EPIDURAL STEROID INJECTION
Anesthesia: Local (Nurse-Monitored) | Laterality: Right | Wound class: Clean Wound: Uninfected operative wounds in which no inflammation occurred

## 2023-08-30 MED ORDER — IOPAMIDOL 200 MG IODINE/ML (41 %) INTRATHECAL SOLUTION
3.0000 mL | INTRATHECAL | Status: AC
Start: 2023-08-30 — End: 2023-08-30
  Administered 2023-08-30: 3 mL via EPIDURAL

## 2023-08-30 MED ORDER — TRIAMCINOLONE ACETONIDE 40 MG/ML SUSPENSION FOR INJECTION
Freq: Once | INTRAMUSCULAR | Status: DC | PRN
Start: 2023-08-30 — End: 2023-08-30
  Administered 2023-08-30 (×3): 40 mg via INTRAMUSCULAR

## 2023-08-30 MED ORDER — BUPIVACAINE (PF) 0.5 % (5 MG/ML) INJECTION SOLUTION
Freq: Once | INTRAMUSCULAR | Status: DC | PRN
Start: 2023-08-30 — End: 2023-08-30
  Administered 2023-08-30: 20 mL via INTRAMUSCULAR

## 2023-08-30 MED ORDER — SODIUM CHLORIDE 0.9 % INJECTION SOLUTION
Freq: Once | INTRAMUSCULAR | Status: DC | PRN
Start: 2023-08-30 — End: 2023-08-30
  Administered 2023-08-30: 10 mL via INTRAMUSCULAR

## 2023-08-30 MED ORDER — IOPAMIDOL 200 MG IODINE/ML (41 %) INTRATHECAL SOLUTION
Freq: Once | INTRATHECAL | Status: DC | PRN
Start: 2023-08-30 — End: 2023-08-30
  Administered 2023-08-30: 3 mL via INTRAMUSCULAR

## 2023-08-30 MED ORDER — LIDOCAINE HCL 10 MG/ML (1 %) INJECTION SOLUTION
Freq: Once | INTRAMUSCULAR | Status: DC | PRN
Start: 2023-08-30 — End: 2023-08-30
  Administered 2023-08-30: 5 mL via INTRAMUSCULAR

## 2023-08-30 SURGICAL SUPPLY — 16 items
APPL ISPRP CHG 10.5ML CHLRPRP HI-LT ORNG PREP STRL LF (MED SURG SUPPLIES) ×2 IMPLANT
BAG 36X36IN BAND EQP (DRAPE/PACKS/SHEETS/OR TOWEL) IMPLANT
CONV USE 23866 - NEEDLE HYPO 27GA 1.5IN STD MONOJECT SS POLYPROP REG BVL LL HUB UL SHRP ANTICORE YW STRL LF  DISP (MED SURG SUPPLIES) ×2 IMPLANT
CONV USE ITEM 321827 - GLOVE SURG 7.5 LF PF CLS STRL_BRN TINT 12IN PROTEXIS (GLOVES AND ACCESSORIES) ×2 IMPLANT
DISC NO SUB - TRAY EPIDRL 28X22IN 18GA 27GA LL TUOHY 1.25IN ASST VOL OVAL 10% PVP IOD PLASTIC 1 SHOT NEEDLE STICK (MED SURG SUPPLIES) ×2 IMPLANT
NEEDLE EPIDRL YW 4.5IN 20GA TUOHY METAL PLASTIC BVL STY REM WNG SLIDE DEPTH INDICATOR STRL LF  DISP (MED SURG SUPPLIES) IMPLANT
NEEDLE EPIDRL YW 6IN 20GA TUOHY METAL PLASTIC BVL STY REM WNG SLIDE DEPTH INDICATOR STRL LF  DISP (MED SURG SUPPLIES) IMPLANT
NEEDLE NERVE STIM 4IN 21GA STPLX A 30D PVC BVL INSL INTGR EXT TUBE DEHP-FR 1 SHOT PERI BLOCK STRL LF (MED SURG SUPPLIES) ×2 IMPLANT
NEEDLE SPINAL 3.5IN 22GA PENCAN BPA PP PVC FREE DEHP-FR STRL LF (MED SURG SUPPLIES) IMPLANT
NEEDLE SPINAL 3.5IN 22GA SPNCN QUINCKE BPA PVC FREE DEHP-FR STRL LF (MED SURG SUPPLIES) IMPLANT
NEEDLE SPINAL BLK 5IN 22GA QUINCKE LONG LGTH REG WL POLYPROP STRL LF  DISP (MED SURG SUPPLIES) IMPLANT
NEEDLE SPINAL CAROLINA BLU 2.5IN 25GA QUINCKE FN METAL PLASTIC STY REM WNG STRL LF  DISP (MED SURG SUPPLIES) IMPLANT
NEEDLE SPINAL CAROLINA BLU 3.5IN 25GA QUINCKE FN METAL PLASTIC STY REM WNG STRL LF  DISP (MED SURG SUPPLIES) IMPLANT
NERVE BLOCK TRAY CSR WRP HYPO  NEEDLE MNBR EXT LINE SYRG UNIV RND LL PLASTIC 1.5IN 30IN 18GA 5ML 10 (MED SURG SUPPLIES) ×2 IMPLANT
PEN SURG MRKNG DVN SKIN DISP NONSMEAR REG TIP FLXB RLR LBL GNTN VIOL STRL LF (MED SURG SUPPLIES) ×2 IMPLANT
SYRINGE LL 3ML LF  STRL GRAD N-PYRG DEHP-FR PVC FREE MED DISP CLR (MED SURG SUPPLIES) IMPLANT

## 2023-08-30 NOTE — Nurses Notes (Signed)
 [place] Pain Rating Scale     On a scale of 0-10, during the past 24 hours, pain has interfered with you usual activity: 7     On a scale of 0-10, during the past 24 hours, pain has interfered with your sleep: 4    On a scale of 0-10, during the past 24 hours, pain has affected your mood: 3     On a scale of 0-10, during the past 24 hours, pain has contributed to your stress: 2     On a scale of 0-10, what is your overall pain Rating: 3

## 2023-08-30 NOTE — Nurses Notes (Signed)
 Meds on field, see MD notes for amounts used.

## 2023-08-30 NOTE — Discharge Instructions (Signed)
 [facility]  AT [facility] - [place], [place] [address] - Phone: [phone])      Complications (Reasons to call):  Marland Kitchen Temperature greater than 101 degrees  . Unusual redness or swelling at the injection site  . Persistent nausea or vomiting or headache  . Persistent weakness, numbness or bleeding  . Loss of bowel or bladder control  . If you are unable to reach your physician and your symptoms are severe, have yourself brought to the nearest Emergency Department or call 911    You may experience:  . You may also experience a temporary increase in the level of your pain  . You may experience weakness, tingling or heavy feelings in your legs the first few hours after your procedure, requiring you to be cautious when ambulating (walking).   . Do NOT drive a car or operate heavy machinery for 24 hours after your procedure    Medications:  Most medications held for your procedure may be resumed one day after the procedure. Please ask your physician if you have questions about specific medications or the procedure itself.    Comfort measures:  . You may use ice over the injection site  . AVOID HEAT for the first 24 hours  . After that ice or heat may be used as needed. DO NOT apply LONGER than 20 minutes and wait 20 minutes before reapplication  . Avoid sitting in a bathtub, hot tub, pool, etc. for 3 days after your procedure    Activity:  . Rest at home for the next 24 hours  . Then increase activity as tolerated  . Typically it is ok to return to work one day after the procedure      It is recommended that you do not receive any vaccinations 7 days prior to a pain clinic procedure, or 7 days after a pain clinic procedure. Side effects from the vaccine may be similar to those that you might experience as an adverse reaction to a pain clinic procedure.

## 2023-08-30 NOTE — OR Surgeon (Signed)
 PATIENT NAME:  Alexis Bowers    MR#:  [id number]    DATE OF BIRTH:  05-15-1985    DATE OF PROCEDURE:  08/30/23    PROCEDURE:  Erector Spinae Plane block at bilateral L5 under fluoroscopic guidance    PERFORMING PHYSICIAN: Nestora Baptise, MD    PREPROCEDURE DIAGNOSIS:   Thoracic back pain, rib pain    POSTPROCEDURE DIAGNOSIS:  Diagnosis unchanged    ANESTHESIA:  Local    ESTIMATED BLOOD LOSS:  Minimal    COMPLICATIONS:  None    CONSENT:  Today's procedure, its potential benefits as well as its risks and potential side effects were reviewed.  Discussed risks of the procedure include bleeding, infection, abscess formation, nerve irritation or damage, seizure, headache, reaction to medications administered, failure of the pain to improve, and exacerbation of the pain.  These risks were explained in detail to the patient, who verbalized understanding and wished to proceed.  Informed consent was signed.    DESCRIPTION OF PROCEDURE:  After informed consent was obtained, the patient was taken to the fluoroscopy suite and placed in the prone position.  The anatomical landmarks were identified with the aid of fluoroscopy in the AP and oblique views.  The skin prepped with antiseptic solution applied x3 and draped in the usual sterile fashion.  Strict aseptic technique was observed.    A 25 G 3.5 inch needle was advanced onto the proximal 1/3 rd of Transverse process of the L5 vertebrae. Contact with os was made. Aspiration was negative. Isovue 2 mL was injected which showed spread in the Erector Spinae plane. A solution containing 5ml of 0.5% Bupivacaine mixed with 1 ml of 40 mg of Kenalog was injected on each side.     The patient tolerated the procedure well and all needles were removed intact.  Hemostasis was maintained and there were no apparent paresthesias or complications.  The skin was wiped clean and Band-Aids were placed as needed.  After a period of observation, the patient remained hemodynamically stable and  neurovascularly intact following the procedure.  The patient was ultimately discharged to home with supervision in good condition and instructed to call the office in 1 day with an update or sooner as warranted.    Nestora Baptise, MD  08/30/2023

## 2023-08-30 NOTE — OR Surgeon (Signed)
 PATIENT NAME: Alexis Bowers  MEDICAL RECORD NUMBER: [id number]  DATE OF BIRTH: October 18, 1985  DATE OF PROCEDURE: 08/30/23  PROCEDURE: Lumbar interlaminar epidural steroid injection under fluoroscopic guidance at the L5-S1 level.  PERFORMING PHYSICIAN: Nestora Baptise MD  PRE-PROCEDURE DIAGNOSIS: Lumbar spondylosis, Lumbar radiculopathy, Low back pain  POST-PROCEDURE DIAGNOSIS: Diagnosis unchanged  ANESTHESIA: Local  ESTIMATED BLOOD LOSS: Minimal  COMPLICATIONS: None  FINDINGS: None  CONSENT: Today's procedure, its potential benefits as well as its risks and potential side effects were reviewed. Discussed risks of the procedure include bleeding, infection, nerve irritation or damage, reactions medications administered, headache, failure of the pain to improve, and exacerbation of the pain. These risks were explained to the patient, who verbalized understanding and who wished to proceed. Informed consent was signed.  DESCRIPTION OF THE PROCEDURE: After written informed consent was obtained, the patient was taken to the fluoroscopy suite and placed in the prone position. Anatomical landmarks were identified by way of fluoroscopy in multiple views. The skin of the lumbar region was prepped using antiseptic applied solution and draped in the usual sterile fashion. Strict aseptic technique was utilized.   The skin and subcutaneous tissues at the needle entry site were infiltrated with 4 mL of 1% preservative-free lidocaine using a 25-gauge 1-1/2-inch needle. A Tuohy needle was then incrementally advanced under fluoroscopy using a loss of resistance technique. Upon entering into the epidural space, a positive loss of resistance to air was noted and a characteristic pop was felt. Proper placement into the epidural space was confirmed with fluoroscopy in multiple views. If there was a need contrast media was also used with appropriate spread under fluoroscopy and no vascular uptake. There were no paresthesias reported. After  negative aspiration for CSF or heme, an injectate consisting of 1 ml of Kenalog 40 mg/mL mixed with 4 ml of preservative-free normal saline was slowly administered.   The patient tolerated the procedure well and all needles were removed intact. Hemostasis was maintained. There were no apparent paresthesias or complications. The skin was wiped clean and a Band-Aid was placed as appropriate. The patient was monitored for an appropriate period of time following the procedure and remained hemodynamically stable and neurovascularly intact.   The patient was ultimately discharged to home with supervision in good condition and instructed to follow up in office in approximately 14 days or sooner as warranted.     Nestora Baptise, MD  08/30/2023

## 2023-09-03 ENCOUNTER — Telehealth (HOSPITAL_BASED_OUTPATIENT_CLINIC_OR_DEPARTMENT_OTHER): Payer: Self-pay | Admitting: Student in an Organized Health Care Education/Training Program

## 2023-09-03 NOTE — Telephone Encounter (Signed)
 Called to see how patient was doing after their LESI Right injection on 08/30/23 with Dr. Zelda Hickman, and what percent of relief they had. No answer, left message to return our call  Alexis Bowers

## 2023-09-05 ENCOUNTER — Encounter (HOSPITAL_BASED_OUTPATIENT_CLINIC_OR_DEPARTMENT_OTHER): Payer: Self-pay | Admitting: Physician Assistant

## 2023-09-05 ENCOUNTER — Other Ambulatory Visit: Payer: Self-pay

## 2023-09-05 ENCOUNTER — Ambulatory Visit (HOSPITAL_COMMUNITY)
Admission: RE | Admit: 2023-09-05 | Discharge: 2023-09-05 | Disposition: A | Discharge status: Home / Self Care | Source: Ambulatory Visit | Attending: Physician Assistant | Admitting: Physician Assistant

## 2023-09-05 ENCOUNTER — Ambulatory Visit: Attending: Physician Assistant | Admitting: Physician Assistant

## 2023-09-05 VITALS — BP 111/74 | HR 61 | Temp 97.6°F | Wt 126.3 lb

## 2023-09-05 DIAGNOSIS — M5416 Radiculopathy, lumbar region: Secondary | ICD-10-CM | POA: Insufficient documentation

## 2023-09-05 DIAGNOSIS — M51369 Other intervertebral disc degeneration, lumbar region without mention of lumbar back pain or lower extremity pain: Secondary | ICD-10-CM | POA: Insufficient documentation

## 2023-09-05 DIAGNOSIS — M5116 Intervertebral disc disorders with radiculopathy, lumbar region: Secondary | ICD-10-CM

## 2023-09-05 DIAGNOSIS — M47816 Spondylosis without myelopathy or radiculopathy, lumbar region: Secondary | ICD-10-CM | POA: Insufficient documentation

## 2023-09-05 DIAGNOSIS — M5126 Other intervertebral disc displacement, lumbar region: Secondary | ICD-10-CM

## 2023-09-05 DIAGNOSIS — M4726 Other spondylosis with radiculopathy, lumbar region: Secondary | ICD-10-CM
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
## 2023-09-25 ENCOUNTER — Encounter (HOSPITAL_BASED_OUTPATIENT_CLINIC_OR_DEPARTMENT_OTHER): Payer: Self-pay | Admitting: NURSE PRACTITIONER

## 2023-09-25 ENCOUNTER — Ambulatory Visit: Payer: Self-pay | Attending: NURSE PRACTITIONER | Admitting: NURSE PRACTITIONER

## 2023-09-25 DIAGNOSIS — M5126 Other intervertebral disc displacement, lumbar region: Secondary | ICD-10-CM

## 2023-09-25 DIAGNOSIS — M4726 Other spondylosis with radiculopathy, lumbar region: Secondary | ICD-10-CM

## 2023-09-25 NOTE — Progress Notes (Addendum)
 [place] [facility]  TELEMEDICINE CONSULTATION       Name: Alexis Bowers  MRN: [id number]  DOB: 1985/05/16    Date of Service:  09/25/2023    TELEMEDICINE DOCUMENTATION:  Patient Location:  MyChart visit from home address: [address]  [place] [address]   Patient/family aware of provider location:  yes  Patient/family consent for telemedicine:  yes      Chief Complaint:  Follow-up after injection therapy      History of Present Illness:  Alexis Bowers is a 38 y.o. female who presents today via a telephonic follow-up visit after a right L5-S1 LESI and bilateral L5 erector spinae plane blocks on 08/30/2023 with Dr. Zelda Hickman.  She reports that she had increased symptoms into her lower extremities after the injection and was ordered a new MRI with Dr. Shelagh Derrick with no acute findings.  She states that the symptoms have since resolved and she feels that she has gotten about 80% ongoing pain relief following the injections and she has been able to do more overall.  She states that she is considering a disc replacement somewhere out of state since it is not available to her and state.  She states that her symptoms are currently more manageable and she is pleased with the results of the injection.      Procedures:  08/30/23- right L5-S1 LESI and bilateral L5 erector spinae plane blocks- increased symptoms afterwards, then 80% ongoing pain relief      Patient Active Problem List    Diagnosis    Encounter for screening for maternal depression    Blood type O+     No past medical history on file.      Past Surgical History:   Procedure Laterality Date    HX BREAST AUGMENTATION  06/2021    HX WISDOM TEETH EXTRACTION      VASCULAR SURGERY Bilateral          Current Outpatient Medications   Medication Sig    diclofenac  sodium (VOLTAREN ) 75 mg Oral Tablet, Delayed Release (E.C.) Take 1 Tablet (75 mg total) by mouth Twice daily (Patient not taking: Reported on 04/18/2023)    MAGNESIUM GLYCINATE ORAL Take 2 Capsules by mouth Every  night    sertraline (ZOLOFT) 25 mg Oral Tablet Take 2 Tablets (50 mg total) by mouth Daily    vitamin D3-vitamin K2 1,250-200 mcg Oral Capsule Take 1 Capsule by mouth Every 7 days     Allergies[1]  Family Medical History:       Problem Relation (Age of Onset)    No Known Problems Paternal Grandfather, Maternal Grandfather, Paternal Grandmother, Maternal Grandmother, Father, Brother, Sister, Son, Daughter, Maternal Aunt, Maternal Uncle, Paternal Aunt, Paternal Uncle, Other    Osteoporosis Mother            Social History     Tobacco Use    Smoking status: Never    Smokeless tobacco: Never   Substance Use Topics    Alcohol use: Yes     Comment: socially         Assessment:  Lumbar disc herniation  Lumbar facet arthropathy  Degenerative disc disease, lumbar  Lumbar radiculopathy  Low back pain  Pain of erector spinae muscle    Plan:  - she will follow-up in the clinic on an as-needed basis and is advised to call if her pain returns to repeat injection therapy      I participated and evaluated the patient as part of a collaborative  telemedicine service.  See above note for additional information.  My findings from this visit are above.  The co-signing physician did not participate in the management of this patient unless otherwise noted.  5 minutes spent on the phone with patient for this visit.        Beatrice Lin, APRN, FNP-C  09/25/2023, 10:19        [1] No Known Allergies

## 2023-10-25 ENCOUNTER — Other Ambulatory Visit (HOSPITAL_BASED_OUTPATIENT_CLINIC_OR_DEPARTMENT_OTHER): Payer: Self-pay | Admitting: Physical Medicine & Rehabilitation

## 2023-10-25 ENCOUNTER — Other Ambulatory Visit (HOSPITAL_BASED_OUTPATIENT_CLINIC_OR_DEPARTMENT_OTHER): Payer: Self-pay | Admitting: Specialist

## 2023-10-25 ENCOUNTER — Telehealth (HOSPITAL_BASED_OUTPATIENT_CLINIC_OR_DEPARTMENT_OTHER): Payer: Self-pay | Admitting: Specialist

## 2023-10-25 DIAGNOSIS — S92902A Unspecified fracture of left foot, initial encounter for closed fracture: Secondary | ICD-10-CM

## 2023-10-25 DIAGNOSIS — S92909A Unspecified fracture of unspecified foot, initial encounter for closed fracture: Secondary | ICD-10-CM

## 2023-10-25 NOTE — Telephone Encounter (Signed)
 Appointment made. Alexis Bowers will call her with the time and date.   [name], [place]

## 2023-10-30 NOTE — Addendum Note (Signed)
 Addended by: Shanekia Latella REBECCA ANN on: 10/30/2023 01:56 PM     Modules accepted: Orders

## 2023-10-31 ENCOUNTER — Ambulatory Visit (HOSPITAL_BASED_OUTPATIENT_CLINIC_OR_DEPARTMENT_OTHER)
Admission: RE | Admit: 2023-10-31 | Discharge: 2023-10-31 | Disposition: A | Discharge status: Home / Self Care | Source: Ambulatory Visit

## 2023-10-31 ENCOUNTER — Other Ambulatory Visit: Payer: Self-pay

## 2023-10-31 ENCOUNTER — Ambulatory Visit: Payer: Self-pay | Attending: Specialist | Admitting: Specialist

## 2023-10-31 ENCOUNTER — Encounter (HOSPITAL_BASED_OUTPATIENT_CLINIC_OR_DEPARTMENT_OTHER): Payer: Self-pay | Admitting: Specialist

## 2023-10-31 DIAGNOSIS — S92902A Unspecified fracture of left foot, initial encounter for closed fracture: Secondary | ICD-10-CM | POA: Insufficient documentation

## 2023-10-31 DIAGNOSIS — S92909A Unspecified fracture of unspecified foot, initial encounter for closed fracture: Secondary | ICD-10-CM | POA: Insufficient documentation

## 2023-10-31 DIAGNOSIS — S92355A Nondisplaced fracture of fifth metatarsal bone, left foot, initial encounter for closed fracture: Secondary | ICD-10-CM | POA: Insufficient documentation

## 2023-10-31 DIAGNOSIS — W1839XA Other fall on same level, initial encounter: Secondary | ICD-10-CM

## 2023-11-01 NOTE — H&P (Unsigned)
 PATIENT NAME: Alexis Bowers, Alexis Bowers   HOSPITAL NUMBER:  [id number]  DATE OF SERVICE: 10/31/2023  DATE OF BIRTH:  [birth year]    HISTORY AND PHYSICAL    HISTORY OF PRESENT ILLNESS:  Alexis Bowers is a new patient.  She is an otherwise pleasant 38 year old female sent here at the request of Dr. Oneita for my opinion and my evaluation.  Her chief complaint is left foot fracture.  She is a pleasant female who unfortunately said she had COVID and was not feeling well.  She got lightheaded and passed out and fell.  She said when she fell she rolled her ankle, she had pain afterwards.  She was seen at an outside facility and diagnosed with a fifth metatarsal fracture.  She was put into a boot and sent here for my opinion and my evaluation.    Past medical history, past surgical history, family history, social history, medications, allergies and review of systems were gone over with the patient and noted on the chart.    PHYSICAL EXAMINATION:  Throughout the physical examination, the patient was noted to be alert, awake, oriented and relaxed. Examination of ankle jerk reflexes was within normal limits. Tests of coordination reveal normal rapid alternating movements. Inspection of the foot reveals no significant atrophy. Normal tone was noted. Lower extremities were without significant malalignment. Sensation was intact throughout. Strength was tested in four planes and was within normal limits. The patient has good dorsalis pedis and tibialis posterior pulses. The toes are pink and warm with less than capillary refill.  She has tenderness to palpation over the the fifth metatarsal on the left foot.  There is slightly increased tenderness over the anterior talofibular ligament.    IMAGING:  X-rays of the foot show otherwise acceptably aligned fifth metatarsal fracture shaft with no other fractures.  It is a simple metatarsal fracture of the foot with an ankle sprain.    ASSESSMENT AND PLAN:  I told her she had a fifth metatarsal  fracture and ankle sprain.  She is going to continue weightbearing as tolerated in a boot.  I told her no surgical intervention needed.  I will see her back in 5 weeks.  We will get x-rays through the left foot.  She is taking vitamin D supplementation.  I strongly encouraged her to continue that.        Alexis Skene, MD                DD:  10/31/2023 16:17:58  DT:  11/01/2023 18:55:45 FP  D#:  [id number]

## 2023-11-27 ENCOUNTER — Other Ambulatory Visit (HOSPITAL_BASED_OUTPATIENT_CLINIC_OR_DEPARTMENT_OTHER): Payer: Self-pay | Admitting: Specialist

## 2023-11-27 DIAGNOSIS — S92355D Nondisplaced fracture of fifth metatarsal bone, left foot, subsequent encounter for fracture with routine healing: Secondary | ICD-10-CM

## 2023-12-05 ENCOUNTER — Ambulatory Visit (HOSPITAL_BASED_OUTPATIENT_CLINIC_OR_DEPARTMENT_OTHER)
Admission: RE | Admit: 2023-12-05 | Discharge: 2023-12-05 | Disposition: A | Discharge status: Home / Self Care | Source: Ambulatory Visit | Admitting: Radiology

## 2023-12-05 ENCOUNTER — Other Ambulatory Visit: Payer: Self-pay

## 2023-12-05 ENCOUNTER — Ambulatory Visit: Payer: Self-pay | Attending: Specialist | Admitting: Specialist

## 2023-12-05 ENCOUNTER — Encounter (HOSPITAL_BASED_OUTPATIENT_CLINIC_OR_DEPARTMENT_OTHER): Payer: Self-pay | Admitting: Specialist

## 2023-12-05 VITALS — Ht 67.0 in | Wt 126.0 lb

## 2023-12-05 DIAGNOSIS — S92355D Nondisplaced fracture of fifth metatarsal bone, left foot, subsequent encounter for fracture with routine healing: Secondary | ICD-10-CM | POA: Insufficient documentation
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
## 2023-12-17 ENCOUNTER — Encounter (HOSPITAL_COMMUNITY): Payer: Self-pay | Admitting: Specialist

## 2023-12-25 ENCOUNTER — Other Ambulatory Visit: Payer: Self-pay

## 2023-12-25 ENCOUNTER — Ambulatory Visit: Payer: Self-pay | Attending: Specialist | Admitting: Specialist

## 2023-12-25 ENCOUNTER — Ambulatory Visit (HOSPITAL_BASED_OUTPATIENT_CLINIC_OR_DEPARTMENT_OTHER): Admission: RE | Admit: 2023-12-25 | Source: Ambulatory Visit

## 2023-12-25 ENCOUNTER — Encounter (HOSPITAL_BASED_OUTPATIENT_CLINIC_OR_DEPARTMENT_OTHER): Payer: Self-pay | Admitting: Specialist

## 2023-12-25 ENCOUNTER — Other Ambulatory Visit (HOSPITAL_BASED_OUTPATIENT_CLINIC_OR_DEPARTMENT_OTHER): Payer: Self-pay | Admitting: Specialist

## 2023-12-25 VITALS — Ht 67.0 in

## 2023-12-25 DIAGNOSIS — S92355D Nondisplaced fracture of fifth metatarsal bone, left foot, subsequent encounter for fracture with routine healing: Secondary | ICD-10-CM

## 2023-12-25 DIAGNOSIS — S92902A Unspecified fracture of left foot, initial encounter for closed fracture: Secondary | ICD-10-CM | POA: Insufficient documentation

## 2023-12-25 NOTE — Progress Notes (Signed)
 error

## 2023-12-26 NOTE — Progress Notes (Signed)
 PATIENT NAME: Alexis Bowers, Alexis Bowers   HOSPITAL NUMBER:  [id number]  DATE OF SERVICE: 12/25/2023  DATE OF BIRTH:  1985/12/27    PROGRESS NOTE    SUBJECTIVE:  Alexis Bowers came, a 38 year old female.  She is now 2 months status post a left dancer's fracture.  She is doing well.  She really has minimal pain.  Otherwise, her foot is healing well.  She did say she was feeling a little malaise, lightheaded, and dizzy.  I had a long talk with her today.    IMAGING:  I got x-rays through the left foot, which shows otherwise well healing and progressive healing of the fifth metatarsal dancer's fracture.   Recent Results (from the past 720 hours)   XR FOOT LEFT     Status: None    Narrative    XR FOOT LEFT performed on 12/05/2023 11:48 AM.    REASON FOR EXAM:  S92.355D: Closed nondisplaced fracture of fifth metatarsal bone of left foot with routine healing, subsequent encounter    TECHNIQUE: 3 views/3 images submitted for interpretation.    COMPARISON:  10/31/2023      Impression    Stable alignment of healing oblique fracture of the fifth metatarsal diaphysis with increased bony callus formation compared to prior study..  No new osseous abnormalities are identified.  Bone mineralization and alignment are otherwise normal.  The soft tissues are unremarkable.              Radiologist location ID: [id number]     XR FOOT LEFT     Status: None    Narrative    Edelin Hackman    PROCEDURE DESCRIPTION: XR FOOT LEFT    CLINICAL INDICATION: S92.902A: Foot fracture, left    TECHNIQUE: 3 views / 3 images submitted.    COMPARISON: 12/05/2023      FINDINGS: 3 views left foot show mid shaft fifth metatarsal fracture with stable alignment and incomplete bony bridging. No new abnormality is seen.      Impression    Fifth metatarsal fracture with incomplete bony bridging. Stable alignment.                Radiologist location ID: [id number]          ASSESSMENT AND PLAN:  Weightbearing as tolerated.  Come out of the shoe.  Resume normal  activities.  I told her just because of her complaints and her concerns, I think it is reasonable we check a parathyroid hormone and a vitamin D level just to make sure they are normal.  If her calcium is not being fell appropriately, it could cause these issues.  I will see her back otherwise as needed.  We will call her if the vitamin D or parathyroid are abnormal.        Lupita Skene, MD                DD:  12/25/2023 16:33:52  DT:  12/26/2023 16:59:19 TM  D#:  [id number]

## 2024-04-20 ENCOUNTER — Encounter (HOSPITAL_BASED_OUTPATIENT_CLINIC_OR_DEPARTMENT_OTHER): Payer: Self-pay

## 2024-05-11 ENCOUNTER — Other Ambulatory Visit (HOSPITAL_COMMUNITY): Payer: Self-pay

## 2024-05-11 DIAGNOSIS — M5416 Radiculopathy, lumbar region: Secondary | ICD-10-CM

## 2024-05-11 DIAGNOSIS — M8580 Other specified disorders of bone density and structure, unspecified site: Secondary | ICD-10-CM

## 2024-05-20 ENCOUNTER — Ambulatory Visit (INDEPENDENT_AMBULATORY_CARE_PROVIDER_SITE_OTHER): Payer: Self-pay

## 2024-06-17 ENCOUNTER — Ambulatory Visit (HOSPITAL_COMMUNITY)

## 2024-06-17 ENCOUNTER — Ambulatory Visit
